# Patient Record
Sex: Male | Born: 1980
Health system: Southern US, Community
[De-identification: ages and names within clinical notes are randomized; demographics above are authoritative.]

## PROBLEM LIST (undated history)

## (undated) DIAGNOSIS — B2790 Infectious mononucleosis, unspecified without complication: Secondary | ICD-10-CM

## (undated) DIAGNOSIS — B001 Herpesviral vesicular dermatitis: Secondary | ICD-10-CM

## (undated) HISTORY — PX: VASECTOMY: SHX75

## (undated) HISTORY — DX: Infectious mononucleosis, unspecified without complication: B27.90

## (undated) HISTORY — DX: Herpesviral vesicular dermatitis: B00.1

---

## 2001-12-18 HISTORY — PX: OTHER SURGICAL HISTORY: SHX169

## 2002-12-18 DIAGNOSIS — B2790 Infectious mononucleosis, unspecified without complication: Secondary | ICD-10-CM

## 2002-12-18 HISTORY — DX: Infectious mononucleosis, unspecified without complication: B27.90

## 2004-04-15 ENCOUNTER — Inpatient Hospital Stay (HOSPITAL_COMMUNITY): Admission: EM | Admit: 2004-04-15 | Discharge: 2004-04-17 | Payer: Self-pay | Admitting: *Deleted

## 2004-04-22 ENCOUNTER — Encounter: Admission: RE | Admit: 2004-04-22 | Discharge: 2004-04-22 | Payer: Self-pay | Admitting: Family Medicine

## 2005-05-22 IMAGING — CT CT NECK W/ CM
1 series · 12 of 14 positions shown, 15 images · IV contrast (100 ML OMNI 300)
Comparison: none

CLINICAL DATA: Sore throat.
 CT OF THE NECK WITH CONTRAST
 3.75 mm cuts were obtained through the neck utilizing IV contrast (100 cc of Omnipaque 300).
 No prior studies for comparison but there is a lateral soft tissue film of the neck showing marked enlargement of the tonsils.
 CT confirms bilateral tonsillar enlargement.  There are areas of low attenuation within both tonsils that are ill-defined.  However, on the right, there is a more well-defined low attenuation lesion laterally, that has a suggestion of an enhancing rim.  This is seen on images #24-26.  The area measures approximately 22 x 16 mm.  It is suspicious for a tonsillar abscess.  At this time I see no definite prevertebral soft tissue swelling, or fluid collections outside the tonsils.  
 No significant adenopathy.  The salivary glands and airway are otherwise normal.
 IMPRESSION
 Enlarged edematous tonsils bilaterally, with a probable right tonsillar abscess.

[Series 2: — · axial · 0.39mm/px · z∈[-338,-124]mm · 12 of 69 slices shown, 15 images]
[im 6/69  soft-tissue]
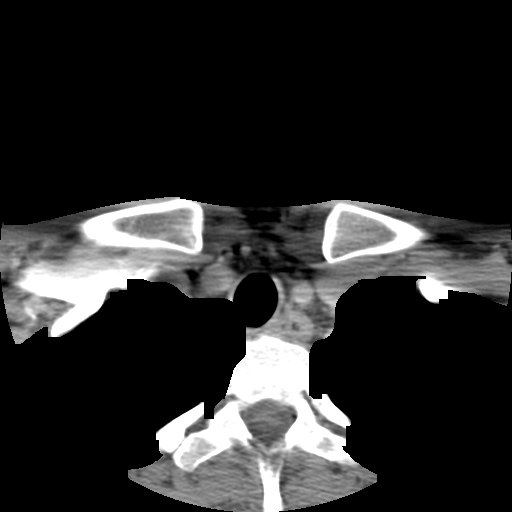
[im 6/69  bone]
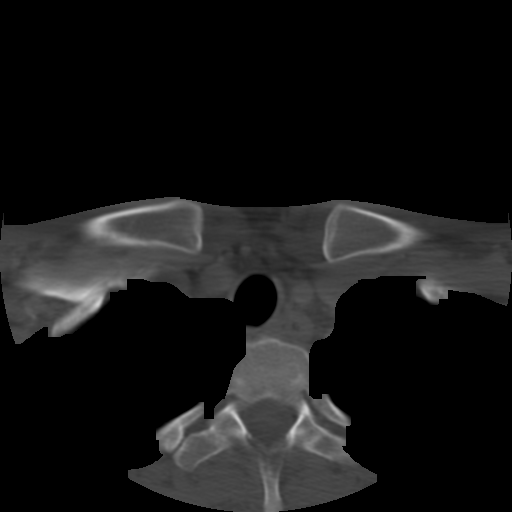
[im 11/69  bone]
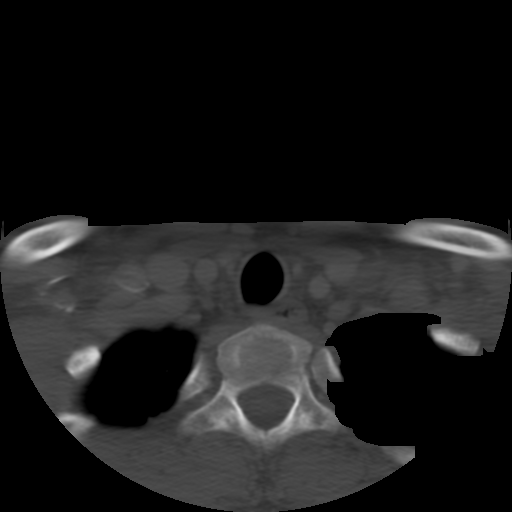
[im 16/69  bone]
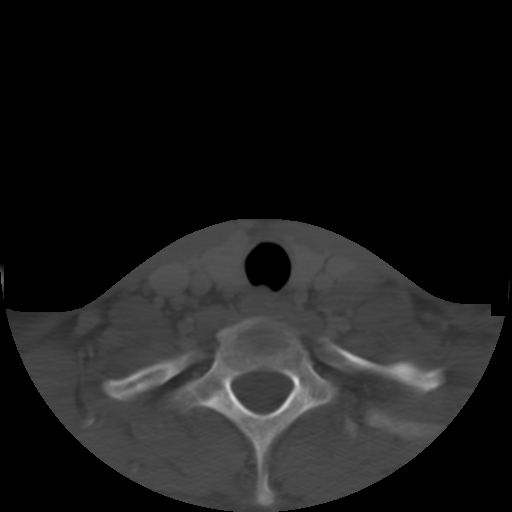
[im 21/69  bone]
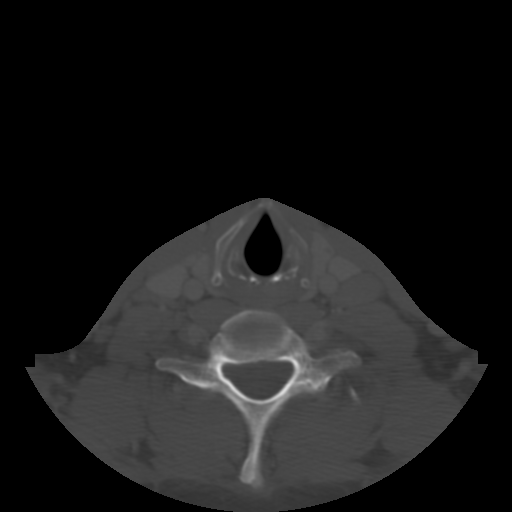
[im 27/69  soft-tissue]
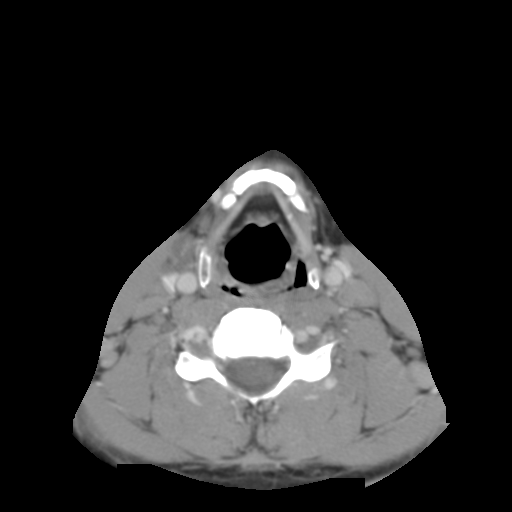
[im 27/69  bone]
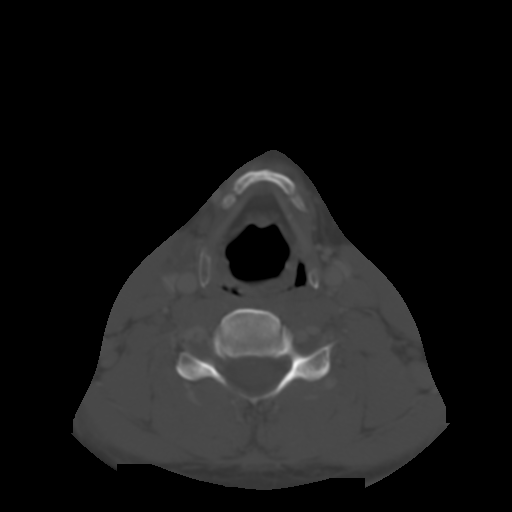
[im 32/69  bone]
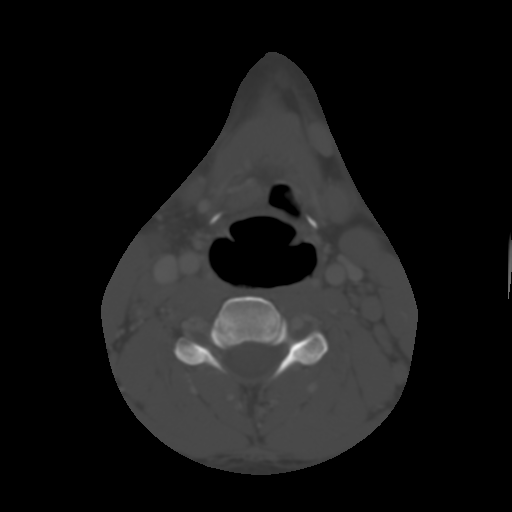
[im 37/69  bone]
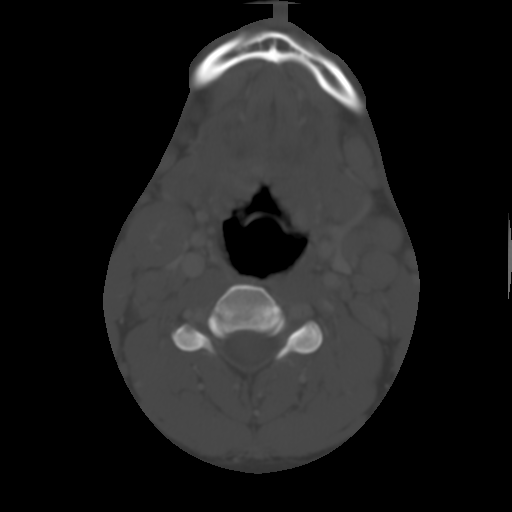
[im 42/69  bone]
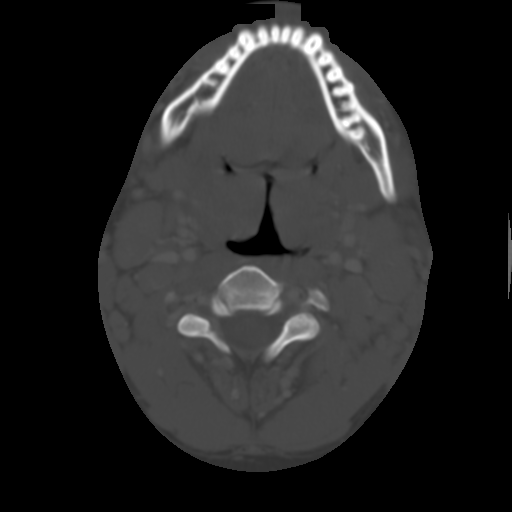
[im 48/69  soft-tissue]
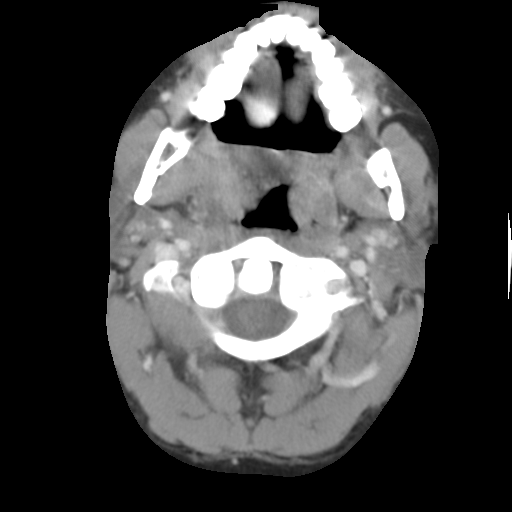
[im 48/69  bone]
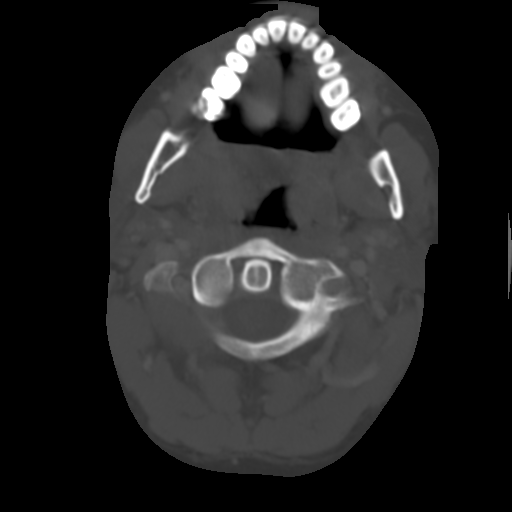
[im 53/69  bone]
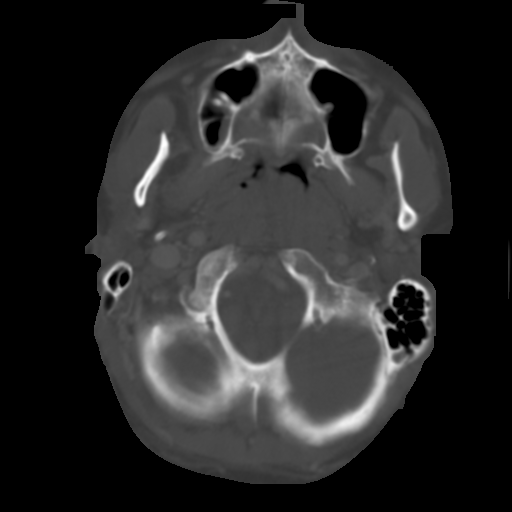
[im 58/69  bone]
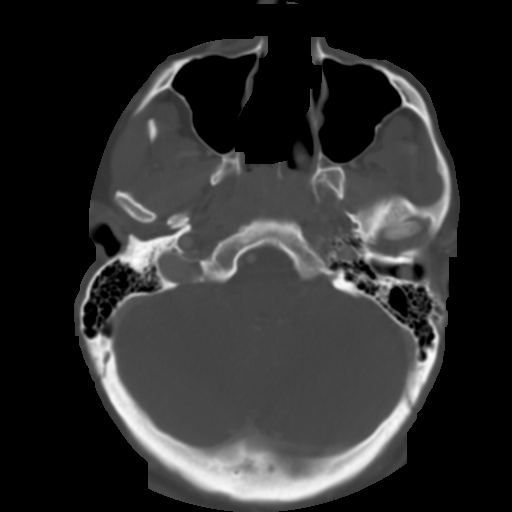
[im 63/69  bone]
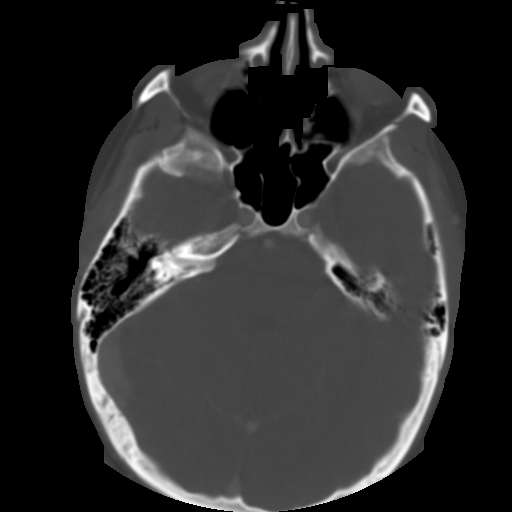

[12 of 14 positions shown; findings below may reference images not displayed]

## 2005-05-22 IMAGING — CR DG NECK SOFT TISSUE
1 series · 1 of 1 positions shown · non-contrast
Comparison: none

CLINICAL DATA: Tonsillar abscess.  Short of breath.  
 SOFT TISSUE LATERAL NECK 
 There is extensive soft tissue thickening of the posterior tongue probably involving the lingual tonsils.  There are no gas bubbles within this, but this could represent acute inflammation or even abscess.  This is narrowing the oral airway.  The tracheal air shadow is normal.  There is no prevertebral soft tissue swelling. 
 IMPRESSION
 Extensive swallowing of the lingual tonsils.

[view not recorded]
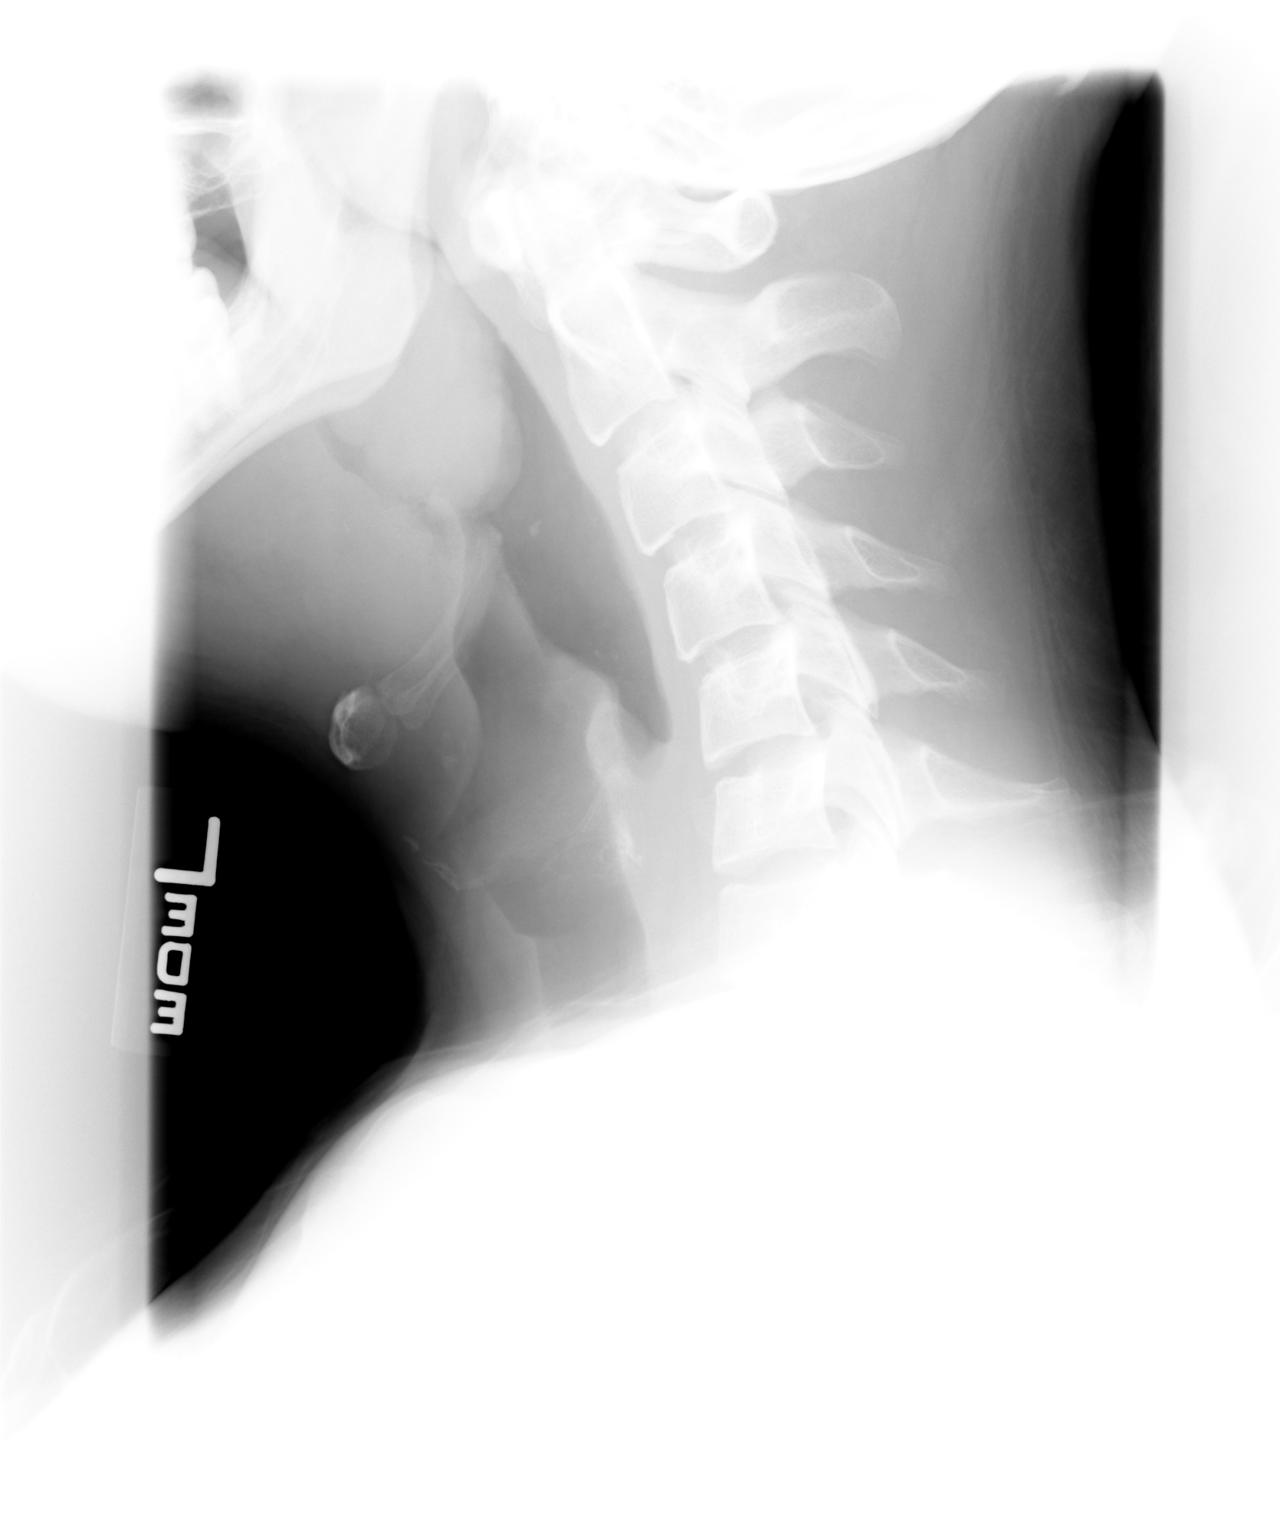

[1 of 1 positions shown; findings below may reference images not displayed]

## 2005-12-18 HISTORY — PX: TONSILLECTOMY AND ADENOIDECTOMY: SUR1326

## 2006-07-04 ENCOUNTER — Ambulatory Visit (HOSPITAL_COMMUNITY): Admission: RE | Admit: 2006-07-04 | Discharge: 2006-07-05 | Payer: Self-pay | Admitting: Otolaryngology

## 2006-12-07 ENCOUNTER — Ambulatory Visit (HOSPITAL_BASED_OUTPATIENT_CLINIC_OR_DEPARTMENT_OTHER): Admission: RE | Admit: 2006-12-07 | Discharge: 2006-12-07 | Payer: Self-pay | Admitting: Otolaryngology

## 2006-12-16 ENCOUNTER — Ambulatory Visit: Payer: Self-pay | Admitting: Internal Medicine

## 2011-04-27 ENCOUNTER — Ambulatory Visit (HOSPITAL_BASED_OUTPATIENT_CLINIC_OR_DEPARTMENT_OTHER): Payer: BC Managed Care – PPO | Attending: Otolaryngology

## 2011-04-27 DIAGNOSIS — G4710 Hypersomnia, unspecified: Secondary | ICD-10-CM | POA: Insufficient documentation

## 2011-05-06 DIAGNOSIS — G4730 Sleep apnea, unspecified: Secondary | ICD-10-CM

## 2011-05-06 DIAGNOSIS — G4710 Hypersomnia, unspecified: Secondary | ICD-10-CM

## 2011-05-06 NOTE — Procedures (Signed)
[name], Samuel Newton                 ACCOUNT NO.:  [id number]  MEDICAL RECORD NO.:  [id number]          PATIENT TYPE:  OUT  LOCATION:  SLEEP CENTER                 FACILITY:  [facility]  PHYSICIAN:  Clinton D. Maple Hudson, MD, FCCP, FACPDATE OF BIRTH:  04-26-80  DATE OF STUDY:  04/27/2011                           NOCTURNAL POLYSOMNOGRAM  REFERRING PHYSICIAN:  DAVID L SHOEMAKER  INDICATION FOR STUDY:  Hypersomnia with sleep apnea.  EPWORTH SLEEPINESS SCORE:  8/24.  BMI 25.  Weight 210 pounds.  Height 77 inches.  Neck 15 inches.  MEDICATIONS:  Home medications were not listed.  SLEEP ARCHITECTURE:  Total sleep time 444.5 minutes with sleep efficiency 96.8%.  Stage I was 7.5%, stage II 69.1%, stage III 4.8%, REM 18.6% of total sleep time.  Sleep latency 7 minutes, REM latency 84 minutes, awake after sleep onset 7 minutes, arousal index 8.8.  RESPIRATORY DATA:  Apnea/hypopnea index (AHI) 1.6 per hour.  A total of 12 events was scored including 4 obstructive apneas, 1 central apnea, 7 hypopneas.  Events were not positional.  REM AHI 3.6 per hour.  There were insufficient events to trigger initiation of CPAP titration by split protocol on the study night.  OXYGEN DATA:  Moderate snoring with oxygen desaturation to a nadir of 86% and the mean oxygen saturation through the study of 94.8% on room air.  CARDIAC DATA:  Normal sinus rhythm.  MOVEMENT-PARASOMNIA:  Occasional limb jerk with little effect on sleep. There were total of 5 limb jerks counted of which 1 was associated with arousal or awakening.  No bathroom trips.  IMPRESSIONS-RECOMMENDATIONS: 1. Unremarkable sleep architecture for Sleep Center environment     without medication taken. 2. Occasional respiratory event with sleep disturbance, within normal     limits, AHI 1.6 per hour (normal range 0/5 per hour).  Non-     positional events with moderate snoring.  Respiratory events tended     to be associated with intervals of REM  sleep.  Oxygen desaturation     to a nadir of 86% with mean oxygen saturation through the     study of 94.8% on room air. 3. Few limb jerks and little associated sleep disturbance related to     body movement.     Clinton D. Maple Hudson, MD, [facility], FACP Diplomate, Biomedical engineer of Sleep Medicine Electronically Signed    CDY/MEDQ  D:  05/06/2011 08:50:58  T:  05/06/2011 12:51:29  Job:  [id number]

## 2011-11-10 ENCOUNTER — Ambulatory Visit (INDEPENDENT_AMBULATORY_CARE_PROVIDER_SITE_OTHER): Payer: BC Managed Care – PPO | Admitting: Family Medicine

## 2011-11-10 ENCOUNTER — Encounter: Payer: Self-pay | Admitting: Family Medicine

## 2011-11-10 VITALS — BP 102/64 | HR 69 | Temp 98.0°F | Ht 77.0 in | Wt 215.0 lb

## 2011-11-10 DIAGNOSIS — Z209 Contact with and (suspected) exposure to unspecified communicable disease: Secondary | ICD-10-CM

## 2011-11-10 DIAGNOSIS — Z23 Encounter for immunization: Secondary | ICD-10-CM

## 2011-11-10 DIAGNOSIS — Z0000 Encounter for general adult medical examination without abnormal findings: Secondary | ICD-10-CM

## 2011-11-10 DIAGNOSIS — F4321 Adjustment disorder with depressed mood: Secondary | ICD-10-CM

## 2011-11-10 NOTE — Patient Instructions (Signed)
Let me know if your mood changes or gets worse.  You can get your results through our phone system.  Follow the instructions on the blue card. Take care.  Glad to see you.

## 2011-11-11 LAB — GC/CHLAMYDIA PROBE AMP, URINE
Chlamydia, Swab/Urine, PCR: NEGATIVE
GC Probe Amp, Urine: NEGATIVE

## 2011-11-12 ENCOUNTER — Encounter: Payer: Self-pay | Admitting: Family Medicine

## 2011-11-12 DIAGNOSIS — Z0000 Encounter for general adult medical examination without abnormal findings: Secondary | ICD-10-CM | POA: Insufficient documentation

## 2011-11-12 DIAGNOSIS — F4321 Adjustment disorder with depressed mood: Secondary | ICD-10-CM | POA: Insufficient documentation

## 2011-11-12 DIAGNOSIS — Z209 Contact with and (suspected) exposure to unspecified communicable disease: Secondary | ICD-10-CM | POA: Insufficient documentation

## 2011-11-12 NOTE — Assessment & Plan Note (Signed)
Flu and tdap today, d/w pt about diet and exercise.  Healthy habits encouraged.

## 2011-11-12 NOTE — Assessment & Plan Note (Addendum)
In counseling and f/o florid depressive sx. No Si/Hi.  Okay for outpatient f/u.  We talked about options.  He may not need medical tx at this point.  We agreed for him to continue with counseling and then notify me/MD if mood worsens.  Can consider trial of wellbutrin at that point. He agrees.  He has no sz history.

## 2011-11-12 NOTE — Progress Notes (Signed)
CPE- See plan.  Routine anticipatory guidance given to patient.  See health maintenance.  Possible depression.  separated from wife 07/2011.  Seeing a Veterinary surgeon.  Some procrastination due to lowered mood but no Si/Hi.  + hope.  We talked about options today.    He would like STD screening given the separation from his wife.    PMH and SH reviewed  Meds, vitals, and allergies reviewed.   ROS: See HPI.  Otherwise negative.    GEN: nad, alert and oriented HEENT: mucous membranes moist NECK: supple w/o LA CV: rrr. PULM: ctab, no inc wob ABD: soft, +bs EXT: no edema SKIN: no acute rash Affect and speech wnl

## 2011-11-12 NOTE — Assessment & Plan Note (Signed)
D/w pt about protection and will check labs.

## 2011-11-13 ENCOUNTER — Telehealth: Payer: Self-pay | Admitting: Radiology

## 2011-11-13 NOTE — Telephone Encounter (Signed)
Notified patient of his results. Also, told the patient that we needed a urine sample for routine ua/micro. He will come by early on Tuesday to leave a urine sample.

## 2011-11-13 NOTE — Telephone Encounter (Signed)
lmom for patient to return my call 

## 2011-11-22 ENCOUNTER — Other Ambulatory Visit (INDEPENDENT_AMBULATORY_CARE_PROVIDER_SITE_OTHER): Payer: BC Managed Care – PPO

## 2011-11-22 DIAGNOSIS — Z202 Contact with and (suspected) exposure to infections with a predominantly sexual mode of transmission: Secondary | ICD-10-CM

## 2011-11-23 LAB — URINALYSIS, MICROSCOPIC ONLY
Crystals: NONE SEEN
Squamous Epithelial / LPF: NONE SEEN

## 2011-11-23 LAB — URINALYSIS
Bilirubin Urine: NEGATIVE
Glucose, UA: NEGATIVE mg/dL
Nitrite: NEGATIVE

## 2014-02-17 ENCOUNTER — Telehealth: Payer: Self-pay

## 2014-02-17 NOTE — Telephone Encounter (Signed)
Congrats.  He should be good with the vaccine in 2012.  Flu shot is recommended if note already done.  Thanks.

## 2014-02-17 NOTE — Telephone Encounter (Signed)
Patient advised.

## 2014-02-17 NOTE — Telephone Encounter (Signed)
Pt expecting first baby in next couple of weeks; pt had tdap 11/10/2011 and pt wants to know if needs to get another tdap or will 2012 be sufficient.pt request cb.

## 2014-02-19 ENCOUNTER — Other Ambulatory Visit: Payer: Self-pay | Admitting: Family Medicine

## 2014-02-19 DIAGNOSIS — Z131 Encounter for screening for diabetes mellitus: Secondary | ICD-10-CM

## 2014-02-19 DIAGNOSIS — Z13220 Encounter for screening for lipoid disorders: Secondary | ICD-10-CM

## 2014-02-20 ENCOUNTER — Other Ambulatory Visit (INDEPENDENT_AMBULATORY_CARE_PROVIDER_SITE_OTHER): Payer: No Typology Code available for payment source

## 2014-02-20 DIAGNOSIS — Z13220 Encounter for screening for lipoid disorders: Secondary | ICD-10-CM

## 2014-02-20 DIAGNOSIS — Z131 Encounter for screening for diabetes mellitus: Secondary | ICD-10-CM

## 2014-02-20 LAB — LIPID PANEL
CHOL/HDL RATIO: 5
Cholesterol: 149 mg/dL (ref 0–200)
HDL: 32.1 mg/dL — AB (ref >39.00)
LDL Cholesterol: 98 mg/dL (ref 0–99)
TRIGLYCERIDES: 93 mg/dL (ref 0.0–149.0)
VLDL: 18.6 mg/dL (ref 0.0–40.0)

## 2014-02-20 LAB — GLUCOSE, RANDOM: Glucose, Bld: 81 mg/dL (ref 70–99)

## 2014-02-27 ENCOUNTER — Ambulatory Visit (INDEPENDENT_AMBULATORY_CARE_PROVIDER_SITE_OTHER): Payer: No Typology Code available for payment source | Admitting: Family Medicine

## 2014-02-27 ENCOUNTER — Encounter: Payer: Self-pay | Admitting: Family Medicine

## 2014-02-27 VITALS — BP 116/80 | HR 68 | Temp 98.0°F | Ht 76.0 in | Wt 257.5 lb

## 2014-02-27 DIAGNOSIS — Z0000 Encounter for general adult medical examination without abnormal findings: Secondary | ICD-10-CM

## 2014-02-27 NOTE — Patient Instructions (Signed)
Good luck with the baby.  I can do a punch biopsy on the spot on your back or we can set you up with dermatology.  Let me know about your preference. I would get a flu shot each fall.

## 2014-02-27 NOTE — Progress Notes (Signed)
Pre visit review using our clinic review tool, if applicable. No additional management support is needed unless otherwise documented below in the visit note.  CPE- See plan.  Routine anticipatory guidance given to patient.  See health maintenance. Tetanus 2012 Flu shot 2014 Colon and prostate cancer screening not due.  Diet and exercise.  Encouraged both.   Living will d/w pt.  Wife designated if patient if incapacitated.   Labs d/w pt.   Wife pregnant, due any day now.   Skin lesion on back, wanted eval.   PMH and SH reviewed  Meds, vitals, and allergies reviewed.   ROS: See HPI.  Otherwise negative.    GEN: nad, alert and oriented HEENT: mucous membranes moist NECK: supple w/o LA CV: rrr. PULM: ctab, no inc wob ABD: soft, +bs EXT: no edema SKIN: no acute rash but raised 6mm pigmented papule on R upper back.

## 2014-02-28 NOTE — Assessment & Plan Note (Addendum)
Routine anticipatory guidance given to patient.  See health maintenance. Tetanus 2012 Flu shot 2014 Colon and prostate cancer screening not due.  Diet and exercise.  Encouraged both.   Living will d/w pt.  Wife designated if patient if incapacitated.   Labs d/w pt.  We can either refer to derm or do a punch biopsy on his back.  He'll consider.  I would do one or the other.  No emergent.

## 2014-09-25 ENCOUNTER — Ambulatory Visit (INDEPENDENT_AMBULATORY_CARE_PROVIDER_SITE_OTHER): Payer: No Typology Code available for payment source | Admitting: Family Medicine

## 2014-09-25 ENCOUNTER — Encounter: Payer: Self-pay | Admitting: Family Medicine

## 2014-09-25 VITALS — BP 114/74 | HR 104 | Temp 98.8°F | Wt 249.0 lb

## 2014-09-25 DIAGNOSIS — J069 Acute upper respiratory infection, unspecified: Secondary | ICD-10-CM

## 2014-09-25 NOTE — Progress Notes (Signed)
Pre visit review using our clinic review tool, if applicable. No additional management support is needed unless otherwise documented below in the visit note.  The prev lesion on his back "dried up and feel off."   Sick ever since his child when to daycare, worse recently. Cough, ST.  Some sputum recently.  Vomited once today, felt better afterward.  Some diarrhea.   Watery diarrhea.  Had tmax 99.4 at night recently, usually ~98.  Had some chills o/w.  Not lightheaded.  Fatigued.  Still with UOP, a little less than normal. R ear ache recently.    Meds, vitals, and allergies reviewed.   ROS: See HPI.  Otherwise, noncontributory.  GEN: nad, alert and oriented HEENT: mucous membranes moist, tm w/o erythema overall but slightly injected, ETD noted, nasal exam w/o erythema, clear discharge noted,  OP with cobblestoning and typical viral appearing changes on the soft palate.  NECK: supple w/o LA CV: rrr.   PULM: ctab, no inc wob EXT: no edema SKIN: no acute rash

## 2014-09-25 NOTE — Patient Instructions (Signed)
Drink sips of fluids, take tylenol as needed, and gargle with warm salt water for your throat.  This should gradually improve.  Take care.  Let us know if you have other concerns.

## 2014-09-27 ENCOUNTER — Encounter: Payer: Self-pay | Admitting: Family Medicine

## 2014-09-27 DIAGNOSIS — J069 Acute upper respiratory infection, unspecified: Secondary | ICD-10-CM | POA: Insufficient documentation

## 2014-09-27 NOTE — Assessment & Plan Note (Signed)
Likely viral, nontoxic, should resolve.  See instructions. D/w pt about handwashing. Congratulated patient on the birth of his daughter earlier this year.

## 2015-04-20 ENCOUNTER — Ambulatory Visit (INDEPENDENT_AMBULATORY_CARE_PROVIDER_SITE_OTHER): Payer: No Typology Code available for payment source | Admitting: Family Medicine

## 2015-04-20 ENCOUNTER — Encounter: Payer: Self-pay | Admitting: Family Medicine

## 2015-04-20 ENCOUNTER — Telehealth: Payer: Self-pay | Admitting: Family Medicine

## 2015-04-20 VITALS — BP 104/70 | HR 68 | Temp 98.0°F | Wt 230.8 lb

## 2015-04-20 DIAGNOSIS — N50819 Testicular pain, unspecified: Secondary | ICD-10-CM | POA: Insufficient documentation

## 2015-04-20 DIAGNOSIS — N508 Other specified disorders of male genital organs: Secondary | ICD-10-CM | POA: Diagnosis not present

## 2015-04-20 LAB — POCT URINALYSIS DIPSTICK
BILIRUBIN UA: NEGATIVE
Glucose, UA: NEGATIVE
Ketones, UA: NEGATIVE
Leukocytes, UA: NEGATIVE
NITRITE UA: NEGATIVE
PH UA: 8
RBC UA: NEGATIVE
SPEC GRAV UA: 1.015
Urobilinogen, UA: 2

## 2015-04-20 NOTE — Progress Notes (Signed)
Pre visit review using our clinic review tool, if applicable. No additional management support is needed unless otherwise documented below in the visit note. 

## 2015-04-20 NOTE — Telephone Encounter (Signed)
Opened in error

## 2015-04-20 NOTE — Patient Instructions (Signed)
I think you have non infectious epididymitis from trauma vs viral inflammation today. Push water, use ibuprofen 400-600 mg three times daily with food, and elevate scrotum with towel. Urine looked ok today. If any worsening, let us know right away or seek urgent care.

## 2015-04-20 NOTE — Addendum Note (Signed)
Addended by: Eustaquio BoydenGUTIERREZ, Taylyn Brame on: 04/20/2015 12:15 PM   Modules accepted: Orders

## 2015-04-20 NOTE — Assessment & Plan Note (Addendum)
UA today normal. Anticipate traumatic noninfectious epididymitis vs viral orchitis after recent HFM enterovirus disease caught from daughter. Discussed supportive care including scheduled NSAIDs, elevation of scrotum and rest. Advised monitor temp and update if fever >101, any worsening, or not improving as expected. Pt agrees with plan. Not consistent with testicular torsion today or bacterial infectious cause. Addendum ==> pt requested CT/GC testing at end of visit. Collected and sent off. UCx sent.

## 2015-04-20 NOTE — Progress Notes (Addendum)
BP 104/70 mmHg  Pulse 68  Temp(Src) 98 F (36.7 C) (Oral)  Wt 230 lb 12.8 oz (104.69 kg)   CC: testicular pain  Subjective:    Patient ID: Samuel Newton, male    DOB: 03-Oct-1981, 34 y.o.   MRN: [id number]  HPI: Kaeo Jacome is a 34 y.o. male presenting on 04/20/2015 for Testicle Pain   3d h/o mild-moderate R testicular pain, worse to touch or when brushing against anything. Yesterday evening discomfort spread to left side, possible chills, and pelvic discomfort. No dysuria, discharge. No testicular swelling. No myalgias, malaise, headaches, abd pain.   Improved with ibuprofen. Lives with wife. Monogamous relationship. No condom use. No new partners.   Friday morning (prior to sxs developing) small dog jumped onto him and landed on testicle.  Recent HFM infection (with hand and foot and mouth rash) through daughter at daycare. Ongoing for 1 week.  Wife with current yeast infection.   No h/o testicular issues in past (torsion, infection, orchitis).  Relevant past medical, surgical, family and social history reviewed and updated as indicated. Interim medical history since our last visit reviewed. Allergies and medications reviewed and updated. Current Outpatient Prescriptions on File Prior to Visit  Medication Sig  . Multiple Vitamin (MULTIVITAMIN) tablet Take 2 tablets by mouth daily.     No current facility-administered medications on file prior to visit.    Review of Systems Per HPI unless specifically indicated above     Objective:    BP 104/70 mmHg  Pulse 68  Temp(Src) 98 F (36.7 C) (Oral)  Wt 230 lb 12.8 oz (104.69 kg)  Wt Readings from Last 3 Encounters:  04/20/15 230 lb 12.8 oz (104.69 kg)  09/25/14 249 lb (112.946 kg)  02/27/14 257 lb 8 oz (116.801 kg)    Physical Exam  Constitutional: He appears well-developed and well-nourished. No distress.  Abdominal: Soft. Bowel sounds are normal. He exhibits no distension and no mass. There is no  tenderness. There is no rebound and no guarding. Hernia confirmed negative in the right inguinal area and confirmed negative in the left inguinal area.  Genitourinary: Penis normal. Right testis shows swelling and tenderness. Right testis shows no mass. Right testis is descended. Left testis shows no mass, no swelling and no tenderness. Left testis is descended.  R testicle tender to palpation, epididymal swelling/tenderness present as well.  Musculoskeletal: He exhibits no edema.  Lymphadenopathy:       Right: No inguinal adenopathy present.       Left: No inguinal adenopathy present.  Skin: Skin is warm and dry. Rash noted.  Resolving blistering rash on palms and soles and dorsal hands and feet  Psychiatric: He has a normal mood and affect.  Nursing note and vitals reviewed.  Results for orders placed or performed in visit on 04/20/15  POCT Urinalysis Dipstick  Result Value Ref Range   Color, UA Yellow    Clarity, UA Clear    Glucose, UA Negative    Bilirubin, UA Negative    Ketones, UA Negative    Spec Grav, UA 1.015    Blood, UA Negative    pH, UA 8.0    Protein, UA Trace    Urobilinogen, UA 2.0    Nitrite, UA Negative    Leukocytes, UA Negative       Assessment & Plan:   Problem List Items Addressed This Visit    Testicular pain - Primary    UA today normal. Anticipate traumatic noninfectious  epididymitis vs viral orchitis after recent HFM enterovirus disease caught from daughter. Discussed supportive care including scheduled NSAIDs, elevation of scrotum and rest. Advised monitor temp and update if fever >101, any worsening, or not improving as expected. Pt agrees with plan. Not consistent with testicular torsion today or bacterial infectious cause. Addendum ==> pt requested CT/GC testing at end of visit. Collected and sent off. UCx sent.      Relevant Orders   POCT Urinalysis Dipstick (Completed)   GC/Chlamydia Probe Amp   Urine culture       Follow up  plan: Return if symptoms worsen or fail to improve.

## 2015-04-22 LAB — URINE CULTURE
COLONY COUNT: NO GROWTH
Organism ID, Bacteria: NO GROWTH

## 2015-04-22 LAB — GC/CHLAMYDIA PROBE AMP
CT PROBE, AMP APTIMA: NEGATIVE
GC Probe RNA: NEGATIVE

## 2016-03-17 ENCOUNTER — Other Ambulatory Visit: Payer: Self-pay | Admitting: Family Medicine

## 2016-03-17 DIAGNOSIS — Z131 Encounter for screening for diabetes mellitus: Secondary | ICD-10-CM

## 2016-03-17 DIAGNOSIS — E786 Lipoprotein deficiency: Secondary | ICD-10-CM

## 2016-03-24 ENCOUNTER — Other Ambulatory Visit (INDEPENDENT_AMBULATORY_CARE_PROVIDER_SITE_OTHER): Payer: BLUE CROSS/BLUE SHIELD

## 2016-03-24 DIAGNOSIS — Z131 Encounter for screening for diabetes mellitus: Secondary | ICD-10-CM | POA: Diagnosis not present

## 2016-03-24 DIAGNOSIS — E786 Lipoprotein deficiency: Secondary | ICD-10-CM | POA: Diagnosis not present

## 2016-03-24 LAB — LIPID PANEL
CHOL/HDL RATIO: 4
Cholesterol: 137 mg/dL (ref 0–200)
HDL: 38.8 mg/dL — ABNORMAL LOW (ref >39.00)
LDL CALC: 82 mg/dL (ref 0–99)
NONHDL: 97.71
TRIGLYCERIDES: 78 mg/dL (ref 0.0–149.0)
VLDL: 15.6 mg/dL (ref 0.0–40.0)

## 2016-03-24 LAB — GLUCOSE, RANDOM: Glucose, Bld: 89 mg/dL (ref 70–99)

## 2016-03-28 ENCOUNTER — Encounter: Payer: No Typology Code available for payment source | Admitting: Family Medicine

## 2016-03-30 ENCOUNTER — Encounter: Payer: Self-pay | Admitting: Family Medicine

## 2016-03-30 ENCOUNTER — Ambulatory Visit (INDEPENDENT_AMBULATORY_CARE_PROVIDER_SITE_OTHER): Payer: BLUE CROSS/BLUE SHIELD | Admitting: Family Medicine

## 2016-03-30 VITALS — BP 102/62 | HR 63 | Temp 97.9°F | Ht 76.0 in | Wt 214.2 lb

## 2016-03-30 DIAGNOSIS — Z0000 Encounter for general adult medical examination without abnormal findings: Secondary | ICD-10-CM

## 2016-03-30 DIAGNOSIS — IMO0001 Reserved for inherently not codable concepts without codable children: Secondary | ICD-10-CM

## 2016-03-30 NOTE — Progress Notes (Signed)
Pre visit review using our clinic review tool, if applicable. No additional management support is needed unless otherwise documented below in the visit note.  CPE- See plan.  Routine anticipatory guidance given to patient.  See health maintenance. Tetanus 2012 Flu shot 2016 Colon and prostate cancer screening not due.  Diet and exercise. Encouraged both.  Living will d/w pt. Wife designated if patient if incapacitated.  Labs d/w pt, wnl. Son is now 5218 days old.  I offered congrats.  All are doing well.    Prev testicle pain resolved after last OV.  No sx in the meantime.   Wants to get vasectomy.    PMH and SH reviewed  Meds, vitals, and allergies reviewed.   ROS: See HPI.  Otherwise negative.    GEN: nad, alert and oriented HEENT: mucous membranes moist NECK: supple w/o LA CV: rrr. PULM: ctab, no inc wob ABD: soft, +bs EXT: no edema SKIN: no acute rash

## 2016-03-30 NOTE — Patient Instructions (Signed)
Shirlee LimerickMarion will call about your referral. Take care.  Congrats.  Glad to see you.

## 2016-04-02 NOTE — Assessment & Plan Note (Signed)
Tetanus 2012  Flu shot 2016  Colon and prostate cancer screening not due.  Diet and exercise. Encouraged both.  Living will d/w pt. Wife designated if patient if incapacitated.  Labs d/w pt, wnl.  Son is now 8618 days old. I offered congrats. All are doing well.  Refer for vasectomy eval.

## 2016-10-18 ENCOUNTER — Encounter: Payer: Self-pay | Admitting: Primary Care

## 2016-10-18 ENCOUNTER — Ambulatory Visit (INDEPENDENT_AMBULATORY_CARE_PROVIDER_SITE_OTHER): Payer: 59 | Admitting: Primary Care

## 2016-10-18 VITALS — BP 120/72 | HR 62 | Temp 98.4°F | Ht 76.0 in | Wt 227.1 lb

## 2016-10-18 DIAGNOSIS — J029 Acute pharyngitis, unspecified: Secondary | ICD-10-CM

## 2016-10-18 DIAGNOSIS — H109 Unspecified conjunctivitis: Secondary | ICD-10-CM | POA: Diagnosis not present

## 2016-10-18 LAB — POCT RAPID STREP A (OFFICE): RAPID STREP A SCREEN: NEGATIVE

## 2016-10-18 MED ORDER — POLYMYXIN B-TRIMETHOPRIM 10000-0.1 UNIT/ML-% OP SOLN: 1.0000 [drp] | Freq: Four times a day (QID) | OPHTHALMIC | 0 refills | Status: DC

## 2016-10-18 NOTE — Progress Notes (Signed)
Pre visit review using our clinic review tool, if applicable. No additional management support is needed unless otherwise documented below in the visit note. 
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
## 2016-10-18 NOTE — Patient Instructions (Addendum)
Start Trimethoprim-Polymixin B eye drops.  Instill 1 drop into both eyes four times daily for 5-7 days.  Your strep test was negative. Your sore throat is likely from drainage. Resume Zyrtec at bedtime and take daily for the next 2-3 weeks.  Please notify us if no improvement in 4-5 days.  It was a pleasure meeting you!

## 2016-10-18 NOTE — Addendum Note (Signed)
Addended by: Tawnya CrookSAMBATH, Adyson Vanburen on: 10/18/2016 10:23 AM   Modules accepted: Orders

## 2016-10-31 ENCOUNTER — Encounter: Payer: Self-pay | Admitting: Family Medicine

## 2018-05-16 ENCOUNTER — Other Ambulatory Visit: Payer: Self-pay | Admitting: Family Medicine

## 2018-05-16 DIAGNOSIS — Z131 Encounter for screening for diabetes mellitus: Secondary | ICD-10-CM

## 2018-05-17 ENCOUNTER — Other Ambulatory Visit (INDEPENDENT_AMBULATORY_CARE_PROVIDER_SITE_OTHER): Payer: 59

## 2018-05-17 DIAGNOSIS — Z131 Encounter for screening for diabetes mellitus: Secondary | ICD-10-CM | POA: Diagnosis not present

## 2018-05-17 LAB — GLUCOSE, RANDOM: Glucose, Bld: 89 mg/dL (ref 70–99)

## 2018-05-30 ENCOUNTER — Ambulatory Visit (INDEPENDENT_AMBULATORY_CARE_PROVIDER_SITE_OTHER): Payer: 59 | Admitting: Family Medicine

## 2018-05-30 ENCOUNTER — Encounter: Payer: Self-pay | Admitting: Family Medicine

## 2018-05-30 VITALS — BP 116/70 | HR 70 | Temp 97.8°F | Ht 77.0 in | Wt 211.2 lb

## 2018-05-30 DIAGNOSIS — Z0000 Encounter for general adult medical examination without abnormal findings: Secondary | ICD-10-CM | POA: Diagnosis not present

## 2018-05-30 DIAGNOSIS — Z7189 Other specified counseling: Secondary | ICD-10-CM

## 2018-05-30 NOTE — Patient Instructions (Addendum)
Ask your insurance about coverage for an MMR titer vs vaccine.   It may be cheaper to get the MMR vaccine at the health department.   Take care.  Glad to see you.  Thanks for your effort.  

## 2018-05-30 NOTE — Progress Notes (Signed)
CPE- See plan.  Routine anticipatory guidance given to patient.  See health maintenance.  The possibility exists that previously documented standard health maintenance information may have been brought forward from a previous encounter into this note.  If needed, that same information has been updated to reflect the current situation based on today's encounter.    Tetanus 2012 Flu shot encouraged, done yearly.   Colon and prostate cancer screening not due.  Diet and exercise d/w pt.  Living will d/w pt. Wife designated if patient if incapacitated.  Labs d/w pt, sugar wnl.   We talked about MMR vaccination.  He had 1 known dose.  He can check on coverage for MMR titer versus vaccine clinic.  It may end up being cheaper to get the MMR vaccine at the health department.  Discussed with patient.  He went 6+ months w/o a soda.  He has intentional weight loss.  I thanked him for his effort.    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  This was a complete surprise.  He is losing his employer based insurance soon. His wife was an employee there too and she was laid off.  He will update me as needed.  His mood is still appropriate.  He is trying to find a job quickly.  Discussed.    PMH and SH reviewed  Meds, vitals, and allergies reviewed.   ROS: Per HPI.  Unless specifically indicated otherwise in HPI, the patient denies:  General: fever. Eyes: acute vision changes ENT: sore throat Cardiovascular: chest pain Respiratory: SOB GI: vomiting GU: dysuria Musculoskeletal: acute back pain Derm: acute rash Neuro: acute motor dysfunction Psych: worsening mood Endocrine: polydipsia Heme: bleeding Allergy: hayfever  GEN: nad, alert and oriented HEENT: mucous membranes moist NECK: supple w/o LA CV: rrr. PULM: ctab, no inc wob ABD: soft, +bs EXT: no edema SKIN: no acute rash

## 2018-05-31 DIAGNOSIS — Z7189 Other specified counseling: Secondary | ICD-10-CM | POA: Insufficient documentation

## 2018-05-31 NOTE — Assessment & Plan Note (Signed)
Tetanus 2012 Flu shot encouraged, done yearly.   Colon and prostate cancer screening not due.  Diet and exercise d/w pt.  Living will d/w pt. Wife designated if patient if incapacitated.  Labs d/w pt, sugar wnl.   We talked about MMR vaccination.  He had 1 known dose.  He can check on coverage for MMR titer versus vaccine clinic.  It may end up being cheaper to get the MMR vaccine at the health department.  Discussed with patient.  

## 2018-05-31 NOTE — Assessment & Plan Note (Signed)
Living will d/w pt. Wife designated if patient if incapacitated.  

## 2018-11-07 ENCOUNTER — Ambulatory Visit (HOSPITAL_COMMUNITY)
Admission: EM | Admit: 2018-11-07 | Discharge: 2018-11-07 | Disposition: A | Discharge status: Home / Self Care | Payer: BC Managed Care – PPO | Attending: Family Medicine | Admitting: Family Medicine

## 2018-11-07 ENCOUNTER — Other Ambulatory Visit: Payer: Self-pay

## 2018-11-07 ENCOUNTER — Encounter (HOSPITAL_COMMUNITY): Payer: Self-pay | Admitting: Emergency Medicine

## 2018-11-07 DIAGNOSIS — B001 Herpesviral vesicular dermatitis: Secondary | ICD-10-CM

## 2018-11-07 MED ORDER — VALACYCLOVIR HCL 1 G PO TABS: 1000.0000 mg | ORAL_TABLET | Freq: Three times a day (TID) | ORAL | 2 refills | Status: DC

## 2018-11-07 NOTE — ED Triage Notes (Signed)
Patient reports cold sores started approx 1 week ago.  Patient says this is the most unusual episode of cold sores.  Patient also has tongue pain, gum pain.
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
## 2019-05-13 ENCOUNTER — Telehealth: Payer: Self-pay | Admitting: Family Medicine

## 2019-05-13 NOTE — Telephone Encounter (Signed)
Best number [phone] Pt has labs 6/29 and he wants to make he has a lipid panel

## 2019-05-14 ENCOUNTER — Other Ambulatory Visit: Payer: Self-pay | Admitting: Family Medicine

## 2019-05-14 DIAGNOSIS — E786 Lipoprotein deficiency: Secondary | ICD-10-CM

## 2019-05-14 DIAGNOSIS — Z131 Encounter for screening for diabetes mellitus: Secondary | ICD-10-CM

## 2019-05-14 NOTE — Telephone Encounter (Signed)
Ordered glucose and lipids.  That should cover it.  Thanks.

## 2019-05-14 NOTE — Telephone Encounter (Signed)
Ivin Booty notified by telephone.

## 2019-06-12 ENCOUNTER — Other Ambulatory Visit: Payer: BC Managed Care – PPO

## 2019-06-16 ENCOUNTER — Other Ambulatory Visit (INDEPENDENT_AMBULATORY_CARE_PROVIDER_SITE_OTHER): Payer: BC Managed Care – PPO

## 2019-06-16 ENCOUNTER — Other Ambulatory Visit: Payer: Self-pay

## 2019-06-16 DIAGNOSIS — E786 Lipoprotein deficiency: Secondary | ICD-10-CM

## 2019-06-16 DIAGNOSIS — Z131 Encounter for screening for diabetes mellitus: Secondary | ICD-10-CM

## 2019-06-16 LAB — LIPID PANEL
Cholesterol: 124 mg/dL (ref 0–200)
HDL: 45.6 mg/dL (ref >39.00)
LDL Cholesterol: 68 mg/dL (ref 0–99)
NonHDL: 77.94
Total CHOL/HDL Ratio: 3
Triglycerides: 48 mg/dL (ref 0.0–149.0)
VLDL: 9.6 mg/dL (ref 0.0–40.0)

## 2019-06-16 LAB — GLUCOSE, RANDOM: Glucose, Bld: 76 mg/dL (ref 70–99)

## 2019-06-19 ENCOUNTER — Other Ambulatory Visit: Payer: Self-pay

## 2019-06-19 ENCOUNTER — Encounter: Payer: Self-pay | Admitting: Family Medicine

## 2019-06-19 ENCOUNTER — Ambulatory Visit (INDEPENDENT_AMBULATORY_CARE_PROVIDER_SITE_OTHER): Payer: BC Managed Care – PPO | Admitting: Family Medicine

## 2019-06-19 VITALS — BP 112/62 | HR 70 | Temp 98.3°F | Ht 77.0 in | Wt 196.5 lb

## 2019-06-19 DIAGNOSIS — Z7189 Other specified counseling: Secondary | ICD-10-CM

## 2019-06-19 DIAGNOSIS — Z0000 Encounter for general adult medical examination without abnormal findings: Secondary | ICD-10-CM | POA: Diagnosis not present

## 2019-06-19 NOTE — Progress Notes (Addendum)
CPE- See plan.  Routine anticipatory guidance given to patient.  See health maintenance.  The possibility exists that previously documented standard health maintenance information may have been brought forward from a previous encounter into this note.  If needed, that same information has been updated to reflect the current situation based on today's encounter.    Tetanus 2012 Flu shot encouraged, done yearly.   Colon and prostate cancer screening not due.  Diet and exercise d/w pt. Doing well with both.   Living will d/w pt. Wife designated if patient is incapacitated.  Labs d/w pt, sugar wnl.   He has intentional weight loss with diet and running.   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   R lateral collarbone is more prominent laterally.  No pain.  L medial collarbone is slightly more prominent medially.  L bunion more prominent than R foot. Not painful.   He wanted all that evaluated since he had noted those changes recently.  Unclear how long they have actually been present, but he had recently noted him.  Recheck pulse 70.  Clearly regular on exam.  No cardiovascular symptoms.  No chest pain shortness of breath or swelling.  PMH and SH reviewed Meds, vitals, and allergies reviewed.   ROS: Per HPI.  Unless specifically indicated otherwise in HPI, the patient denies:  General: fever. Eyes: acute vision changes ENT: sore throat Cardiovascular: chest pain Respiratory: SOB GI: vomiting GU: dysuria Musculoskeletal: acute back pain Derm: acute rash Neuro: acute motor dysfunction Psych: worsening mood Endocrine: polydipsia Heme: bleeding Allergy: hayfever  GEN: nad, alert and oriented HEENT: ncat NECK: supple w/o LA CV: rrr. PULM: ctab, no inc wob ABD: soft, +bs EXT: no edema SKIN: no acute rash Benign-appearing left bunion noted. He has some prominence near the right [facility] joint and he also has some  mild prominence near the left [place] joint. He has normal range of motion of both arms and no pain on AC joint loading.  Neither of the areas described above have erythema or swelling otherwise.

## 2019-06-19 NOTE — Patient Instructions (Signed)
Thank you for your effort.  Take care.  Glad to see you.  Update me as needed.  I would get a flu shot each fall.

## 2019-06-21 ENCOUNTER — Encounter: Payer: Self-pay | Admitting: Family Medicine

## 2019-06-21 NOTE — Assessment & Plan Note (Signed)
Living will d/w pt.  Wife designated if patient is incapacitated. ?

## 2019-06-21 NOTE — Assessment & Plan Note (Signed)
Tetanus 2012 Flu shot encouraged, done yearly.   Colon and prostate cancer screening not due.  Diet and exercise d/w pt. Doing well with both.   Living will d/w pt. Wife designated if patient if incapacitated.  Labs d/w pt, sugar wnl.   He has intentional weight loss with diet and running.  He has done really well with that.  He has a bunion and a slightly prominent right AC area and a slightly prominent left [place] area.  None of that appears to need intervention.  He had lost weight recently and I question if some of the changes noted near his clavicles are more prominent given his weight loss.  None of this looks like an acute issue and he has no recent injuries.  Discussed, reassured.  Update me as needed.

## 2019-07-04 ENCOUNTER — Telehealth: Payer: Self-pay | Admitting: Family Medicine

## 2019-07-04 DIAGNOSIS — Z9189 Other specified personal risk factors, not elsewhere classified: Secondary | ICD-10-CM

## 2019-07-04 NOTE — Telephone Encounter (Signed)
I put in the order, please direct him to a site for testing.  Thanks.

## 2019-07-04 NOTE — Telephone Encounter (Signed)
Patient stated he would like a covid test ordered Patient does not have any symptoms but traveled out of state recently and would like to be tested.   C/B # [phone]

## 2019-07-04 NOTE — Telephone Encounter (Signed)
Left detailed message on voicemail.  

## 2019-07-07 ENCOUNTER — Other Ambulatory Visit: Payer: Self-pay | Admitting: Family Medicine

## 2019-07-07 DIAGNOSIS — Z20822 Contact with and (suspected) exposure to covid-19: Secondary | ICD-10-CM

## 2019-07-10 LAB — NOVEL CORONAVIRUS, NAA: SARS-CoV-2, NAA: NOT DETECTED

## 2019-08-06 ENCOUNTER — Other Ambulatory Visit: Payer: Self-pay

## 2019-08-06 DIAGNOSIS — Z20822 Contact with and (suspected) exposure to covid-19: Secondary | ICD-10-CM

## 2019-08-07 LAB — NOVEL CORONAVIRUS, NAA: SARS-CoV-2, NAA: NOT DETECTED

## 2020-01-19 ENCOUNTER — Ambulatory Visit: Payer: BC Managed Care – PPO | Attending: Internal Medicine

## 2020-01-19 ENCOUNTER — Other Ambulatory Visit: Payer: BC Managed Care – PPO

## 2020-01-19 DIAGNOSIS — Z20822 Contact with and (suspected) exposure to covid-19: Secondary | ICD-10-CM

## 2020-01-20 LAB — NOVEL CORONAVIRUS, NAA: SARS-CoV-2, NAA: NOT DETECTED

## 2021-02-15 ENCOUNTER — Telehealth: Payer: Self-pay | Admitting: Family Medicine

## 2021-02-15 NOTE — Telephone Encounter (Signed)
Error

## 2021-02-19 ENCOUNTER — Other Ambulatory Visit: Payer: Self-pay | Admitting: Family Medicine

## 2021-02-19 DIAGNOSIS — Z131 Encounter for screening for diabetes mellitus: Secondary | ICD-10-CM

## 2021-02-19 NOTE — Progress Notes (Signed)
Please check with patient about his upcoming lab visit.  It appears that all he needs is a fasting glucose.  He can get that done ahead of time if he wants to, but it would be fine to do a fasting fingerstick at the office visit.  That way he could skip the lab appointment.  Please see which way he wants to go.  Thanks.

## 2021-02-22 NOTE — Progress Notes (Signed)
LMTCB to see about doing labs tomorrow or if he just wants to do them at his appt. If wants done at appt will need to change to a morning appt as he needs fasting labs done.

## 2021-02-23 ENCOUNTER — Other Ambulatory Visit: Payer: Self-pay

## 2021-02-23 ENCOUNTER — Other Ambulatory Visit (INDEPENDENT_AMBULATORY_CARE_PROVIDER_SITE_OTHER): Payer: BC Managed Care – PPO

## 2021-02-23 ENCOUNTER — Other Ambulatory Visit: Payer: BC Managed Care – PPO

## 2021-02-23 DIAGNOSIS — Z131 Encounter for screening for diabetes mellitus: Secondary | ICD-10-CM | POA: Diagnosis not present

## 2021-02-23 LAB — GLUCOSE, RANDOM: Glucose, Bld: 86 mg/dL (ref 70–99)

## 2021-02-23 NOTE — Progress Notes (Signed)
Patient had labs done this morning.

## 2021-03-03 ENCOUNTER — Ambulatory Visit (INDEPENDENT_AMBULATORY_CARE_PROVIDER_SITE_OTHER): Payer: BC Managed Care – PPO | Admitting: Family Medicine

## 2021-03-03 ENCOUNTER — Encounter: Payer: Self-pay | Admitting: Family Medicine

## 2021-03-03 ENCOUNTER — Other Ambulatory Visit: Payer: Self-pay

## 2021-03-03 VITALS — BP 118/72 | HR 78 | Temp 98.4°F | Ht 77.0 in | Wt 213.0 lb

## 2021-03-03 DIAGNOSIS — Z7189 Other specified counseling: Secondary | ICD-10-CM

## 2021-03-03 DIAGNOSIS — Z0000 Encounter for general adult medical examination without abnormal findings: Secondary | ICD-10-CM | POA: Diagnosis not present

## 2021-03-03 DIAGNOSIS — M79646 Pain in unspecified finger(s): Secondary | ICD-10-CM

## 2021-03-03 DIAGNOSIS — B001 Herpesviral vesicular dermatitis: Secondary | ICD-10-CM

## 2021-03-03 DIAGNOSIS — M21619 Bunion of unspecified foot: Secondary | ICD-10-CM

## 2021-03-03 DIAGNOSIS — M791 Myalgia, unspecified site: Secondary | ICD-10-CM

## 2021-03-03 MED ORDER — VALACYCLOVIR HCL 1 G PO TABS: 1000.0000 mg | ORAL_TABLET | Freq: Three times a day (TID) | ORAL | 2 refills | Status: DC

## 2021-03-03 NOTE — Patient Instructions (Addendum)
Try using a thumb spica splint and see if that helps.  Take care.  Glad to see you. Update me as needed.  Thanks for your effort.  If the bunion bothers you more then let me know.

## 2021-03-03 NOTE — Progress Notes (Signed)
This visit occurred during the SARS-CoV-2 public health emergency.  Safety protocols were in place, including screening questions prior to the visit, additional usage of staff PPE, and extensive cleaning of exam room while observing appropriate contact time as indicated for disinfecting solutions.  CPE- See plan.  Routine anticipatory guidance given to patient.  See health maintenance.  The possibility exists that previously documented standard health maintenance information may have been brought forward from a previous encounter into this note.  If needed, that same information has been updated to reflect the current situation based on today's encounter.    Tetanus 2012 Flu shot encouraged, done yearly.  covid vaccine up to date.   PNA and shingles not due.   Colon and prostate cancer screening not due.  Diet and exercise d/w pt.  Living will d/w pt. Wife designated if patient if incapacitated.  Labs d/w pt, sugar wnl.   D/w pt about valtrex use.  Used prn.  He starts tx when sx start.  That works.  He does not appear to need daily suppressive therapy.  L thumb pain with grip.  No pain with ROM.  Pain can last a few days, when triggered.  Electrical pain, radial side of the thumb.  R handed.    Center of chest is sore, medial pectoral area noted with ROM for deep bench press.  Slowly getting better.   Clearly reproducible.  No exertional symptoms otherwise.  Bunion L foot.  Not symptomatic usually, d/w pt about observation.  If the   PMH and SH reviewed Meds, vitals, and allergies reviewed.  ROS: Per HPI.  Unless specifically indicated otherwise in HPI, the patient denies:  General: fever. Eyes: acute vision changes ENT: sore throat Cardiovascular: chest pain Respiratory: SOB GI: vomiting GU: dysuria Musculoskeletal: acute back pain Derm: acute rash Neuro: acute motor dysfunction Psych: worsening mood Endocrine: polydipsia Heme: bleeding Allergy: hayfever  GEN: nad, alert  and oriented HEENT: ncat NECK: supple w/o LA CV: rrr. PULM: ctab, no inc wob ABD: soft, +bs EXT: no edema SKIN: no acute rash Bunion noted on left foot.  No skin breakdown. He has reproducible medial pectoral muscle irritation with stretching pectoral muscles.  No rash.  No bruising. He has normal sensation and grip in the left hand.  He does not have pain on testing extensor or flexor tendons.  Normal capillary refill.  No joint erythema or swelling.

## 2021-03-06 DIAGNOSIS — M21619 Bunion of unspecified foot: Secondary | ICD-10-CM | POA: Insufficient documentation

## 2021-03-06 DIAGNOSIS — M79646 Pain in unspecified finger(s): Secondary | ICD-10-CM | POA: Insufficient documentation

## 2021-03-06 DIAGNOSIS — B001 Herpesviral vesicular dermatitis: Secondary | ICD-10-CM | POA: Insufficient documentation

## 2021-03-06 DIAGNOSIS — M791 Myalgia, unspecified site: Secondary | ICD-10-CM | POA: Insufficient documentation

## 2021-03-06 NOTE — Assessment & Plan Note (Signed)
Continue as needed treatment update me as needed.  Prescription for Valtrex sent.

## 2021-03-06 NOTE — Assessment & Plan Note (Signed)
Living will d/w pt. Wife designated if patient if incapacitated.

## 2021-03-06 NOTE — Assessment & Plan Note (Signed)
Gradually getting better.  Does not appear to be an intrathoracic issue.  Clearly reproducible.  Relative rest in the meantime then gradually return to exercise.  He agrees.

## 2021-03-06 NOTE — Assessment & Plan Note (Signed)
Tetanus 2012 Flu shot encouraged, done yearly.  covid vaccine up to date.   PNA and shingles not due.   Colon and prostate cancer screening not due.  Diet and exercise d/w pt.  Living will d/w pt. Wife designated if patient if incapacitated.  Labs d/w pt, sugar wnl.

## 2021-03-06 NOTE — Assessment & Plan Note (Signed)
I think he likely irritated the digital nerve and that was causing episodic symptoms.  He could use a spica splint to try to protect the area.  He will update me as needed.  See after visit summary.

## 2021-03-06 NOTE — Assessment & Plan Note (Signed)
Not affecting his gait.  Would observe for now.  We can refer to podiatry if needed.

## 2022-10-29 NOTE — Progress Notes (Unsigned)
    Azariah Latendresse T. Asjah Rauda, MD, CAQ Sports Medicine [facility] [address] [place], [address]  Phone: [phone]  FAX: [phone]  Dreyden Rohrman - 41 y.o. male  MRN [id number]  Date of Birth: 26-Oct-1981  Date: 10/30/2022  PCP: Joaquim Nam, MD  Referral: Joaquim Nam, MD  No chief complaint on file.  Subjective:   Leemon Ayala is a 41 y.o. very pleasant male patient with There is no height or weight on file to calculate BMI. who presents with the following:  Patient presents with L heel pain.     Review of Systems is noted in the HPI, as appropriate  Objective:   There were no vitals taken for this visit.  GEN: No acute distress; alert,appropriate. PULM: Breathing comfortably in no respiratory distress PSYCH: Normally interactive.   Laboratory and Imaging Data:  Assessment and Plan:   ***

## 2022-10-30 ENCOUNTER — Ambulatory Visit: Payer: BC Managed Care – PPO | Admitting: Family Medicine

## 2022-10-30 ENCOUNTER — Encounter: Payer: Self-pay | Admitting: Family Medicine

## 2022-10-30 VITALS — BP 106/72 | HR 62 | Temp 98.0°F | Ht 76.25 in | Wt 231.0 lb

## 2022-10-30 DIAGNOSIS — M7661 Achilles tendinitis, right leg: Secondary | ICD-10-CM

## 2022-10-30 DIAGNOSIS — M76821 Posterior tibial tendinitis, right leg: Secondary | ICD-10-CM | POA: Diagnosis not present

## 2022-10-30 DIAGNOSIS — M79672 Pain in left foot: Secondary | ICD-10-CM

## 2022-10-30 MED ORDER — NITROGLYCERIN 0.2 MG/HR TD PT24: MEDICATED_PATCH | TRANSDERMAL | 2 refills | Status: DC

## 2022-10-30 MED ORDER — VALACYCLOVIR HCL 1 G PO TABS: 1000.0000 mg | ORAL_TABLET | Freq: Three times a day (TID) | ORAL | 0 refills | Status: DC

## 2022-10-30 NOTE — Patient Instructions (Signed)
Achilles Tendon Rehab  Start easy.  It is ok if there is mild discomfort, but if there is pain, then back off how much rehab you are doing.  For achilles rehab, you basically just need to concentrate on the ankle going up and down.  Start: Calf raises while seated First lower and then raise on both feet Assist lifting with hands and then slowly lower  Begin with 3 sets of 10 repetitions Increase by 5 repetitions every 3 days  Goal is 3 sets of 30 repetitions  If feels good at 3 sets of 30 - add backpack with 5 lbs  Increase by 5 lbs per week to max of 30 lbs   Alternative: If you have weights at home, you can put a dumbbell upright on the knee of the effected heel.  Go up with assistance from your hands, and then lower slowly.  If this is easy, then change to calf lowers standing on a step.  A heel cup of any brand will elevate the heel and take stress off of the achilles tendon. I particularly like the brand called Tuli's heel cups.  They cup the back of the heel, too.  They can be hard to find unless you can order off of the computer like on [employer]. Any heel cup is ok, though, including ones you can find at any pharmacy  

## 2023-03-07 ENCOUNTER — Emergency Department (HOSPITAL_COMMUNITY)
Admission: EM | Admit: 2023-03-07 | Discharge: 2023-03-07 | Disposition: A | Discharge status: Home / Self Care | Payer: BC Managed Care – PPO | Attending: Emergency Medicine | Admitting: Emergency Medicine

## 2023-03-07 ENCOUNTER — Emergency Department (HOSPITAL_COMMUNITY): Payer: BC Managed Care – PPO

## 2023-03-07 ENCOUNTER — Telehealth: Payer: Self-pay

## 2023-03-07 ENCOUNTER — Other Ambulatory Visit: Payer: Self-pay

## 2023-03-07 DIAGNOSIS — R0789 Other chest pain: Secondary | ICD-10-CM

## 2023-03-07 LAB — BASIC METABOLIC PANEL
Anion gap: 9 (ref 5–15)
BUN: 9 mg/dL (ref 6–20)
CO2: 24 mmol/L (ref 22–32)
Calcium: 8.8 mg/dL — ABNORMAL LOW (ref 8.9–10.3)
Chloride: 104 mmol/L (ref 98–111)
Creatinine, Ser: 0.83 mg/dL (ref 0.61–1.24)
GFR, Estimated: >60 mL/min (ref >60)
Glucose, Bld: 92 mg/dL (ref 70–99)
Potassium: 3.8 mmol/L (ref 3.5–5.1)
Sodium: 137 mmol/L (ref 135–145)

## 2023-03-07 LAB — CBC
HCT: 43.5 % (ref 39.0–52.0)
Hemoglobin: 14.8 g/dL (ref 13.0–17.0)
MCH: 30.5 pg (ref 26.0–34.0)
MCHC: 34 g/dL (ref 30.0–36.0)
MCV: 89.5 fL (ref 80.0–100.0)
Platelets: 277 10*3/uL (ref 150–400)
RBC: 4.86 MIL/uL (ref 4.22–5.81)
RDW: 12.3 % (ref 11.5–15.5)
WBC: 6.3 10*3/uL (ref 4.0–10.5)
nRBC: 0 % (ref 0.0–0.2)

## 2023-03-07 LAB — TROPONIN I (HIGH SENSITIVITY)
Troponin I (High Sensitivity): 3 ng/L (ref <18)
Troponin I (High Sensitivity): 3 ng/L (ref <18)

## 2023-03-07 NOTE — Telephone Encounter (Signed)
Will await ER report.  Thanks.   

## 2023-03-07 NOTE — Telephone Encounter (Signed)
[facility] - Client TELEPHONE ADVICE RECORD [facility] Patient Name: Samuel Newton AS Gender: Male DOB: 28-Dec-1980 Age: 42 Y 56 M 28 D Return Phone Number: [phone] (Primary) Address: City/ State/ Zip: [place]  [address] Client [facility] - Client Client Site [place] - Day Provider Elsie Stain "Brigitte Pulse MD Contact Type Call Who Is Calling Patient / Member / Family / Caregiver Call Type Triage / Clinical Relationship To Patient Self Return Phone Number [phone] (Primary) Chief Complaint CHEST PAIN - pain, pressure, heaviness or tightness Reason for Call Symptomatic / Request for Health Information Initial Comment Caller was transferred from the office, patient is experiencing chest pain. Translation No Nurse Assessment Nurse: Radford Pax, RN, Eugene Garnet Date/Time Eilene Ghazi Time): 03/07/2023 10:53:34 AM Confirm and document reason for call. If symptomatic, describe symptoms. ---Off and on chest pain. Feels like a muscle pain. Does the patient have any new or worsening symptoms? ---Yes Will a triage be completed? ---Yes Related visit to physician within the last 2 weeks? ---No Does the PT have any chronic conditions? (i.e. diabetes, asthma, this includes High risk factors for pregnancy, etc.) ---No Is this a behavioral health or substance abuse call? ---No Guidelines Guideline Title Affirmed Question Affirmed Notes Nurse Date/Time (Eastern Time) Chest Pain [1] Chest pain (or "angina") comes and goes AND [2] is happening more often (increasing in frequency) or getting worse (increasing in severity) (Exception: Chest pains that last only a few seconds.) Turner, RN, Eugene Garnet 03/07/2023 10:55:55 AM PLEASE NOTE: All timestamps contained within this report are represented as [place] Standard Time. CONFIDENTIALTY NOTICE: This fax transmission is intended only for the addressee. It contains  information that is legally privileged, confidential or otherwise protected from use or disclosure. If you are not the intended recipient, you are strictly prohibited from reviewing, disclosing, copying using or disseminating any of this information or taking any action in reliance on or regarding this information. If you have received this fax in error, please notify us immediately by telephone so that we can arrange for its return to [place]. Phone: [phone], Toll-Free: [phone], Fax: [phone] Page: 2 of 2 Call Id: [id number] [place]. Time Eilene Ghazi Time) Disposition Final User 03/07/2023 10:52:04 AM Send to Urgent Clarnce Flock 03/07/2023 11:00:24 AM Go to ED Now Yes Radford Pax, RN, Eugene Garnet Final Disposition 03/07/2023 11:00:24 AM Go to ED Now Yes Radford Pax, RN, Sharion Settler Disagree/Comply Comply Caller Understands Yes PreDisposition Call Doctor Care Advice Given Per Guideline GO TO ED NOW: * You need to be seen in the Emergency Department. NOTHING BY MOUTH: * Do not eat or drink anything for now. CALL EMS IF: * Severe difficulty breathing occurs * You become worse CARE ADVICE given per Chest Pain (Adult) guideline. Referrals [place]

## 2023-03-07 NOTE — ED Provider Notes (Signed)
[facility] Provider Note   CSN: [id number] Arrival date & time: 03/07/23  1133     History  Chief Complaint  Patient presents with   Chest Pain    Dacen Prudencio is a 42 y.o. male.  The history is provided by the patient and medical records. No language interpreter was used.  Chest Pain    42 year old male presenting here with complaint of chest pain.  Patient report he has had intermittent chest discomfort for several months.  Described pain as a muscle pull sensation on the left side of his chest that happens sporadically sometimes lasting for entire day.  Pain is not associate with lightheadedness, dizziness, nausea, diaphoresis, or shortness of breath.  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  He reach out to his PCP who recommend patient come to the ER for evaluation.  Patient mention his pain is mostly improving.  In the past pain seems to help with ibuprofen.  He denies having fever or chills no runny nose sneezing or coughing no exertional chest pain.  He denies tobacco use strong family history of cardiac disease.  Denies any strenuous activities or heavy lifting.  Home Medications Prior to Admission medications   Medication Sig Start Date End Date Taking? Authorizing Provider  nitroGLYCERIN (NITRODUR - DOSED IN MG/24 HR) 0.2 mg/hr patch Apply 1/4 patch to affected area as directed by MD and change every 24 hours. 10/30/22   Copland, Frederico Hamman, MD  Omega-3 Fatty Acids (FISH OIL PO) Take by mouth.    [provider]  valACYclovir (VALTREX) 1000 MG tablet Take 1 tablet (1,000 mg total) by mouth 3 (three) times daily. Take for 1 day.  Disp for 7 courses. 10/30/22   Owens Loffler, MD      Allergies    Patient has no known allergies.    Review of Systems   Review of Systems  Cardiovascular:  Positive for chest pain.  All other systems reviewed and are  negative.   Physical Exam Updated Vital Signs BP 125/82   Pulse 67   Temp 97.6 F (36.4 C) (Oral)   Resp 12   SpO2 96%  Physical Exam Vitals and nursing note reviewed.  Constitutional:      General: He is not in acute distress.    Appearance: He is well-developed.  HENT:     Head: Atraumatic.  Eyes:     Conjunctiva/sclera: Conjunctivae normal.  Cardiovascular:     Rate and Rhythm: Normal rate and regular rhythm.     Pulses: Normal pulses.     Heart sounds: Normal heart sounds.  Pulmonary:     Effort: Pulmonary effort is normal.     Breath sounds: Normal breath sounds. No wheezing, rhonchi or rales.  Chest:     Chest wall: No tenderness.  Abdominal:     Palpations: Abdomen is soft.     Tenderness: There is no abdominal tenderness.  Musculoskeletal:     Cervical back: Neck supple.     Right lower leg: No edema.     Left lower leg: No edema.  Skin:    Findings: No rash.  Neurological:     Mental Status: He is alert.     ED Results / Procedures / Treatments   Labs (all labs ordered are listed, but only abnormal results are displayed) Labs Reviewed  BASIC METABOLIC PANEL - Abnormal; Notable for the  following components:      Result Value   Calcium 8.8 (*)    All other components within normal limits  CBC  TROPONIN I (HIGH SENSITIVITY)  TROPONIN I (HIGH SENSITIVITY)    EKG None  Date: 03/07/2023  Rate: 74  Rhythm: normal sinus rhythm  QRS Axis: normal  Intervals: normal  ST/T Wave abnormalities: normal  Conduction Disutrbances: none  Narrative Interpretation:   Old EKG Reviewed: No significant changes noted    Radiology DG Chest 2 View  Result Date: 03/07/2023 CLINICAL DATA:  Chest pain in 42 year old male. EXAM: CHEST - 2 VIEW COMPARISON:  None Available. FINDINGS: EKG leads project over the chest. Cardiomediastinal contours and hilar structures are normal. No lobar consolidation, pleural effusion or pneumothorax. On limited assessment no acute  skeletal process. Nodular density projects over the RIGHT lower chest up to 9 mm size. IMPRESSION: 1. No acute cardiopulmonary disease. 2. Nodular density projects over the RIGHT lower chest up to 9 mm size. This may represent a nipple shadow. Consider repeat PA and lateral chest with nipple markers in place when the patient is able to exclude small nodule. Electronically Signed   By: Zetta Bills M.D.   On: 03/07/2023 12:42    Procedures Procedures    Medications Ordered in ED Medications - No data to display  ED Course/ Medical Decision Making/ A&P                             Medical Decision Making Amount and/or Complexity of Data Reviewed Labs: ordered. Radiology: ordered.   BP 136/88   Pulse 63   Temp 98.4 F (36.9 C) (Oral)   Resp 18   SpO2 98%   79:45 PM  42 year old male presenting here with complaint of chest pain.  Patient report he has had intermittent chest discomfort for several months.  Described pain as a muscle pull sensation on the left side of his chest that happens sporadically sometimes lasting for entire day.  Pain is not associate with lightheadedness, dizziness, nausea, diaphoresis, or shortness of breath.  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  He reach out to his PCP who recommend patient come to the ER for evaluation.  Patient mention his pain is mostly improving.  In the past pain seems to help with ibuprofen.  He denies having fever or chills no runny nose sneezing or coughing no exertional chest pain.  He denies tobacco use strong family history of cardiac disease.  Denies any strenuous activities or heavy lifting.  On exam this is a well-appearing male appears to be in no acute discomfort.  Heart with normal rate and rhythm, lungs clear to auscultation bilaterally abdomen is soft nontender, no peripheral edema.  No reproducible chest wall tenderness or overlying skin changes.  -Labs  ordered, independently viewed and interpreted by me.  Labs remarkable for normal troponin, normal electrolytes panel -The patient was maintained on a cardiac monitor.  I personally viewed and interpreted the cardiac monitored which showed an underlying rhythm of: NSR -Imaging independently viewed and interpreted by me and I agree with radiologist's interpretation.  Result remarkable for CXR without acute changes.  A nodule noted to R lower chest, likely nipple shadow.  This is not the location that pt has pain.  He may have a repeat chest xray outpt if concerns.  He does not have any significant  risk factors for lung cancer.  I offer repeat xray, pt declined.   -This patient presents to the ED for concern of chest pain, this involves an extensive number of treatment options, and is a complaint that carries with it a high risk of complications and morbidity.  The differential diagnosis includes panic attack, GERD, gastritis, pleurisy, pna, acs, PE, shingle -Co morbidities that complicate the patient evaluation includes none -Treatment includes none -Reevaluation of the patient after these medicines showed that the patient resolved -PCP office notes or outside notes reviewed -Escalation to admission/observation considered: patients feels much better, is comfortable with discharge, and will follow up with PCP -Prescription medication considered, patient comfortable with OTC meds -Social Determinant of Health considered   4:54 PM Patient with a heart score of 0, low risk of Mace.  He is PERC negative, doubt PE.  Negative delta troponin, labs overall reassuring, chest x-ray and EKG unremarkable at this time patient is symptom-free and is stable to be discharged home to follow-up outpatient with PCP.  Return precaution given.         Final Clinical Impression(s) / ED Diagnoses Final diagnoses:  Atypical chest pain    Rx / DC Orders ED Discharge Orders     None         Domenic Moras,  PA-C 03/07/23 1655    Charlesetta Shanks, MD 03/07/23 1731

## 2023-03-07 NOTE — ED Triage Notes (Signed)
Patient here with complaint of intermittent left sided chest pain that reminds of him of having a pulled muscle. Patient states he called his PCP and was referred to ED. Patient occurs randomly, sometimes on waking or in the middle of the day. Patient denies any alleviating or aggravating factors. Patient is alert, oriented, speaking in complete sentences, and is in no apparent distress at this time.

## 2023-03-07 NOTE — Telephone Encounter (Signed)
I spoke with pt; pt is just arriving at [facility] ED. Sending note to Dr Damita Dunnings and Damita Dunnings pool.

## 2023-03-08 ENCOUNTER — Telehealth: Payer: Self-pay

## 2023-03-08 NOTE — Transitions of Care (Post Inpatient/ED Visit) (Signed)
   03/08/2023  Name: Samuel Newton MRN: [id number] DOB: 1981-08-13  Today's TOC FU Call Status:    Attempted to reach the patient regarding the most recent Inpatient/ED visit.  Follow Up Plan: Additional outreach attempts will be made to reach the patient to complete the Transitions of Care (Post Inpatient/ED visit) call.   [place] LPN [place] Advisor Direct Dial [phone]

## 2023-03-12 NOTE — Transitions of Care (Post Inpatient/ED Visit) (Signed)
   03/12/2023  Name: Samuel Newton MRN: [id number] DOB: 10-12-1981  Today's TOC FU Call Status: Today's TOC FU Call Status:: Unsuccessful Call (2nd Attempt) Unsuccessful Call (1st Attempt) Date: 03/08/23 Unsuccessful Call (2nd Attempt) Date: 03/12/23  Attempted to reach the patient regarding the most recent Inpatient/ED visit.  Follow Up Plan: Additional outreach attempts will be made to reach the patient to complete the Transitions of Care (Post Inpatient/ED visit) call.   Signature Francella Solian, CMA

## 2023-03-12 NOTE — Telephone Encounter (Signed)
Patient returned call regarding this e/d f/u call. He is scheduled to be seen on 03/15/2023.

## 2023-03-13 NOTE — Transitions of Care (Post Inpatient/ED Visit) (Signed)
   03/13/2023  Name: Samuel Newton MRN: [id number] DOB: 21-Aug-1981  Today's TOC FU Call Status: Today's TOC FU Call Status:: Successful TOC FU Call Competed Unsuccessful Call (1st Attempt) Date: 03/08/23 Unsuccessful Call (2nd Attempt) Date: 03/12/23 [facility] FU Call Complete Date: 03/13/23  Transition Care Management Follow-up Telephone Call Date of Discharge: 03/07/23 Discharge [facility] Type of Discharge: Emergency Department Reason for ED Visit: Cardiac Conditions Cardiac Conditions Diagnosis:  (Atypical chest pain) How have you been since you were released from the hospital?: Better Any questions or concerns?: Yes  Items Reviewed: Did you receive and understand the discharge instructions provided?: No Medications obtained and verified?: Yes (Medications Reviewed) Any new allergies since your discharge?: No Dietary orders reviewed?: NA Do you have support at home?: Yes  Home Care and Equipment/Supplies: [place] Ordered?: NA Any new equipment or medical supplies ordered?: NA  Functional Questionnaire: Do you need assistance with bathing/showering or dressing?: No Do you need assistance with meal preparation?: No Do you need assistance with eating?: No Do you have difficulty maintaining continence: No Do you need assistance with getting out of bed/getting out of a chair/moving?: No Do you have difficulty managing or taking your medications?: No  Follow up appointments reviewed: PCP Follow-up appointment confirmed?: Yes Date of PCP follow-up appointment?: 03/15/23 Follow-up Provider: Dr Damita Dunnings [facility] Follow-up appointment confirmed?: NA Do you need transportation to your follow-up appointment?: No Do you understand care options if your condition(s) worsen?: Yes-patient verbalized understanding    [place] LPN [place] Direct Dial [phone]

## 2023-03-15 ENCOUNTER — Encounter: Payer: Self-pay | Admitting: Family Medicine

## 2023-03-15 ENCOUNTER — Ambulatory Visit (INDEPENDENT_AMBULATORY_CARE_PROVIDER_SITE_OTHER)
Admission: RE | Admit: 2023-03-15 | Discharge: 2023-03-15 | Disposition: A | Discharge status: Home / Self Care | Payer: BC Managed Care – PPO | Source: Ambulatory Visit | Attending: Family Medicine | Admitting: Family Medicine

## 2023-03-15 ENCOUNTER — Ambulatory Visit: Payer: BC Managed Care – PPO | Admitting: Family Medicine

## 2023-03-15 VITALS — BP 108/76 | HR 66 | Temp 98.2°F | Ht 76.25 in | Wt 236.0 lb

## 2023-03-15 DIAGNOSIS — R9389 Abnormal findings on diagnostic imaging of other specified body structures: Secondary | ICD-10-CM

## 2023-03-15 DIAGNOSIS — G4730 Sleep apnea, unspecified: Secondary | ICD-10-CM

## 2023-03-15 NOTE — Progress Notes (Signed)
ER follow up.    IMPRESSION: 1. No acute cardiopulmonary disease. 2. Nodular density projects over the RIGHT lower chest up to 9 mm size. This may represent a nipple shadow. Consider repeat PA and lateral chest with nipple markers in place when the patient is able to exclude small nodule.  We talked about prev discussion re: chest discomfort. From 03/03/21 "Center of chest is sore, medial pectoral area noted with ROM for deep bench press.  Slowly getting better.   Clearly reproducible.  No exertional symptoms otherwise."  Discussed more recent symptoms.Pain resolved in the meantime, after a day.  He doesn't have exertional sx.  Some nsaid use but not frequently.  He had not used nsaids right before pain started.    Wife noted patient stopping breathing at night.  He is trying to sleep on his side.  Discussed recheck with [facility] re: OSA.   Meds, vitals, and allergies reviewed.   ROS: Per HPI unless specifically indicated in ROS section   GEN: nad, alert and oriented HEENT: ncat NECK: supple w/o LA CV: rrr.  PULM: ctab, no inc wob ABD: soft, +bs EXT: no edema SKIN: no acute rash 16.5" neck circumference.

## 2023-03-15 NOTE — Patient Instructions (Addendum)
Go to the lab on the way out.   If you have mychart we'll likely use that to update you.    Take care.  Glad to see you. Try using TUMS if recurrent episodes.

## 2023-03-18 DIAGNOSIS — G4730 Sleep apnea, unspecified: Secondary | ICD-10-CM | POA: Insufficient documentation

## 2023-03-18 DIAGNOSIS — R9389 Abnormal findings on diagnostic imaging of other specified body structures: Secondary | ICD-10-CM | POA: Insufficient documentation

## 2023-03-18 NOTE — Assessment & Plan Note (Signed)
See notes on follow-up chest x-ray.  Discussed that he was not at high risk for a coronary issue causing his pain.  It is reasonable to consider using Tums if he has another episode to see if that makes a difference.  See after visit summary.  Previous x-ray discussed with patient.

## 2023-03-18 NOTE — Assessment & Plan Note (Signed)
Refer to [facility] 

## 2024-05-01 ENCOUNTER — Telehealth: Payer: Self-pay

## 2024-05-01 DIAGNOSIS — Z131 Encounter for screening for diabetes mellitus: Secondary | ICD-10-CM

## 2024-05-01 DIAGNOSIS — Z13220 Encounter for screening for lipoid disorders: Secondary | ICD-10-CM

## 2024-05-01 NOTE — Telephone Encounter (Signed)
 Copied from CRM [id number]. Topic: Clinical - Lab/Test Results >> Apr 30, 2024  3:58 PM Kita Perish H wrote: Reason for CRM: Patient wants to have labs done prior to physical appointment scheduled for 6/30, no orders in system.  Josh [phone]

## 2024-05-02 NOTE — Telephone Encounter (Signed)
 He has been scheduled for labs the lab orders just need to be placed

## 2024-05-04 NOTE — Telephone Encounter (Signed)
 Ordered. Thanks

## 2024-05-04 NOTE — Addendum Note (Signed)
 Addended by: Donnie Galea on: 05/04/2024 02:59 PM   Modules accepted: Orders

## 2024-06-09 ENCOUNTER — Ambulatory Visit: Payer: Self-pay | Admitting: Family Medicine

## 2024-06-09 ENCOUNTER — Other Ambulatory Visit (INDEPENDENT_AMBULATORY_CARE_PROVIDER_SITE_OTHER): Payer: Self-pay

## 2024-06-09 DIAGNOSIS — Z13220 Encounter for screening for lipoid disorders: Secondary | ICD-10-CM

## 2024-06-09 DIAGNOSIS — Z131 Encounter for screening for diabetes mellitus: Secondary | ICD-10-CM | POA: Diagnosis not present

## 2024-06-09 LAB — LIPID PANEL
Cholesterol: 143 mg/dL (ref 0–200)
HDL: 36.5 mg/dL — ABNORMAL LOW (ref >39.00)
LDL Cholesterol: 91 mg/dL (ref 0–99)
NonHDL: 106
Total CHOL/HDL Ratio: 4
Triglycerides: 74 mg/dL (ref 0.0–149.0)
VLDL: 14.8 mg/dL (ref 0.0–40.0)

## 2024-06-09 LAB — GLUCOSE, RANDOM: Glucose, Bld: 91 mg/dL (ref 70–99)

## 2024-06-16 ENCOUNTER — Encounter: Payer: Self-pay | Admitting: Family Medicine

## 2024-06-16 ENCOUNTER — Ambulatory Visit (INDEPENDENT_AMBULATORY_CARE_PROVIDER_SITE_OTHER): Payer: Self-pay | Admitting: Family Medicine

## 2024-06-16 VITALS — BP 106/58 | HR 75 | Temp 98.5°F | Ht 76.1 in | Wt 232.0 lb

## 2024-06-16 DIAGNOSIS — G4730 Sleep apnea, unspecified: Secondary | ICD-10-CM

## 2024-06-16 DIAGNOSIS — Z0000 Encounter for general adult medical examination without abnormal findings: Secondary | ICD-10-CM | POA: Diagnosis not present

## 2024-06-16 DIAGNOSIS — Z7189 Other specified counseling: Secondary | ICD-10-CM

## 2024-06-16 DIAGNOSIS — Z8249 Family history of ischemic heart disease and other diseases of the circulatory system: Secondary | ICD-10-CM

## 2024-06-16 NOTE — Progress Notes (Unsigned)
 CPE- See plan.  Routine anticipatory guidance given to patient.  See health maintenance.  The possibility exists that previously documented standard health maintenance information may have been brought forward from a previous encounter into this note.  If needed, that same information has been updated to reflect the current situation based on today's encounter.    He got a call from the cancer clinic- this was likely a wrong number.  D/w pt. I do not see any outgoing message recorded in his chart and I do not see any issue to be addressed by the cancer center.  Tetanus 2012 Flu shot encouraged, done yearly.   covid vaccine prev done.  PNA and shingles not due.   Colon and prostate cancer screening not due.  Diet and exercise d/w pt.  Living will d/w pt. Wife designated if patient if incapacitated.  Labs d/w pt, sugar wnl.   He had sleep study done.  Started CPAP and compliant.  He had to get used to the mask, etc.  He doesn't feel a sig change in the meantime, not yet.    FH CAD.  His father had valve replacement with CABG in his 5s.  D/w pt about possible calcium scoring later in his 30s.  No CP.  He is still able to jog, restarting cough to 5K.  He can run a mile w/o CP.    PMH and SH reviewed  Meds, vitals, and allergies reviewed.   ROS: Per HPI.  Unless specifically indicated otherwise in HPI, the patient denies:  General: fever. Eyes: acute vision changes ENT: sore throat Cardiovascular: chest pain Respiratory: SOB GI: vomiting GU: dysuria Musculoskeletal: acute back pain Derm: acute rash Neuro: acute motor dysfunction Psych: worsening mood Endocrine: polydipsia Heme: bleeding Allergy: hayfever  GEN: nad, alert and oriented HEENT: ncat, mild cerumen deposit bilaterally without TM obstruction NECK: supple w/o LA CV: rrr. PULM: ctab, no inc wob ABD: soft, +bs EXT: no edema SKIN: no acute rash  Labs discussed with patient.  The 10-year ASCVD risk score (Arnett DK,  et al., 2019) is: 1%   Values used to calculate the score:     Age: 43 years     Clincally relevant sex: Male     Is Non-Hispanic African American: No     Diabetic: No     Tobacco smoker: No     Systolic Blood Pressure: 106 mmHg     Is BP treated: No     HDL Cholesterol: 36.5 mg/dL     Total Cholesterol: 143 mg/dL

## 2024-06-16 NOTE — Patient Instructions (Signed)
 Tetanus and flu this fall.  Take care.  Glad to see you. Debrox and rinse.  Update me as needed.

## 2024-06-18 DIAGNOSIS — Z8249 Family history of ischemic heart disease and other diseases of the circulatory system: Secondary | ICD-10-CM | POA: Insufficient documentation

## 2024-06-18 NOTE — Assessment & Plan Note (Signed)
Living will d/w pt. Wife designated if patient if incapacitated.  

## 2024-06-18 NOTE — Assessment & Plan Note (Signed)
 FH CAD.  His father had valve replacement with CABG in his 79s.  D/w pt about possible calcium scoring later in his 29s.  No CP.  He is still able to jog, restarting cough to 5K.  He can run a mile w/o CP.

## 2024-06-18 NOTE — Assessment & Plan Note (Signed)
 Would still continue as is with CPAP.  Compliant.

## 2024-06-18 NOTE — Assessment & Plan Note (Signed)
 Tetanus 2012 Flu shot encouraged, done yearly.   covid vaccine prev done.  PNA and shingles not due.   Colon and prostate cancer screening not due.  Diet and exercise d/w pt.  Living will d/w pt. Wife designated if patient if incapacitated.  Labs d/w pt, sugar wnl.

## 2024-06-27 ENCOUNTER — Other Ambulatory Visit: Payer: Self-pay | Admitting: Family Medicine

## 2024-06-27 MED ORDER — VALACYCLOVIR HCL 1 G PO TABS: 1000.0000 mg | ORAL_TABLET | Freq: Three times a day (TID) | ORAL | 0 refills | Status: AC

## 2024-06-27 NOTE — Telephone Encounter (Signed)
 Copied from CRM [id number]. Topic: Clinical - Medication Refill >> Jun 27, 2024  9:30 AM [name] wrote: Medication: valACYclovir  (VALTREX ) 1000 MG tablet  Please note, this is something Pt and Samuel Newton spoke about in his last appt on 6/30 and he thought he had enough refills, but does not. He would like Samuel Newton to be the one to prescribe this for him please.  Has the patient contacted their pharmacy? Yes (Agent: If no, request that the patient contact the pharmacy for the refill. If patient does not wish to contact the pharmacy document the reason why and proceed with request.) (Agent: If yes, when and what did the pharmacy advise?)  This is the patient's preferred pharmacy:  [facility] [phone] - [place], [place] - [address] AT [facility] [address] [place] [address] Phone: [phone] Fax: [phone]  Is this the correct pharmacy for this prescription? Yes If no, delete pharmacy and type the correct one.   Has the prescription been filled recently? No  Is the patient out of the medication? Yes  Has the patient been seen for an appointment in the last year OR does the patient have an upcoming appointment? Yes  Can we respond through MyChart? Yes  Agent: Please be advised that Rx refills may take up to 3 business days. We ask that you follow-up with your pharmacy.

## 2024-06-27 NOTE — Telephone Encounter (Signed)
 Sent. Thanks.

## 2024-10-23 ENCOUNTER — Telehealth: Payer: Self-pay | Admitting: Family Medicine

## 2024-10-23 ENCOUNTER — Ambulatory Visit: Payer: Self-pay

## 2024-10-23 NOTE — Telephone Encounter (Signed)
 FYI Only or Action Required?: FYI only for provider: appointment scheduled on 11/7 at [facility].  Patient was last seen in primary care on 06/16/2024 by Cleatus Arlyss RAMAN, MD.  Called Nurse Triage reporting Chest Pain.  Symptoms began several days ago.  Interventions attempted: OTC medications: Tylenol.  Symptoms are: unchanged.  Triage Disposition: See Physician Within 24 Hours  Patient/caregiver understands and will follow disposition?: Yes  Copied from CRM [id number]. Topic: Clinical - Red Word Triage >> Oct 23, 2024  8:23 AM Samuel Newton wrote: Red Word that prompted transfer to Nurse Triage:  He is having pain in his lower right side area that is getting worse and will not go away. Right now pain is at a 3. He is wanting an appointment to check it out. Reason for Disposition  [1] MILD pain (e.g., does not interfere with normal activities) AND [2] comes and goes (cramps) AND [3] present > 72 hours  (Exception: This same abdominal pain is a chronic symptom recurrent or ongoing AND present > 4 weeks.)  Answer Assessment - Initial Assessment Questions 1. LOCATION: Where does it hurt?      Right upper abdomen, under ribs area  2. RADIATION: Does the pain shoot anywhere else? (e.g., chest, back)     No  3. ONSET: When did the pain begin? (e.g., minutes, hours or days ago)      2 days ago  4. SUDDEN: Gradual or sudden onset?     Gradual  5. PATTERN Does the pain come and go, or is it constant?    Constant  6. SEVERITY: How bad is the pain?  (e.g., Scale 1-10; mild, moderate, or severe)     3/10, but is increasing  7. RECURRENT SYMPTOM: Have you ever had this type of stomach pain before? If Yes, ask: When was the last time? and What happened that time?      No  8. AGGRAVATING FACTORS: Does anything seem to cause this pain? (e.g., foods, stress, alcohol)     Deep breaths, pain with sneezing and movement  9. CARDIAC SYMPTOMS: Do you have any of the  following symptoms: chest pain, difficulty breathing, sweating, nausea?     No  10. OTHER SYMPTOMS: Do you have any other symptoms? (e.g., back pain, diarrhea, fever, urination pain, vomiting)       Runny nose (hx of allergies)  Protocols used: Abdominal Pain - Upper-A-AH

## 2024-10-23 NOTE — Telephone Encounter (Signed)
 Lm and sent MyChart that patient's appointment on 10/24/2024 has been changed to [facility]( Dr Cleatus patient) location with Dr Bennett at 10:40 am. E2C2 triage nurse scheduled at wrong location.

## 2024-10-24 ENCOUNTER — Ambulatory Visit

## 2024-10-24 ENCOUNTER — Ambulatory Visit: Payer: Self-pay

## 2024-10-24 ENCOUNTER — Ambulatory Visit (INDEPENDENT_AMBULATORY_CARE_PROVIDER_SITE_OTHER)
Admission: RE | Admit: 2024-10-24 | Discharge: 2024-10-24 | Disposition: A | Discharge status: Home / Self Care | Source: Ambulatory Visit

## 2024-10-24 ENCOUNTER — Telehealth: Payer: Self-pay

## 2024-10-24 ENCOUNTER — Ambulatory Visit: Admitting: Internal Medicine

## 2024-10-24 VITALS — BP 122/78 | HR 73 | Temp 98.6°F | Ht 76.25 in | Wt 228.0 lb

## 2024-10-24 DIAGNOSIS — R079 Chest pain, unspecified: Secondary | ICD-10-CM

## 2024-10-24 LAB — C-REACTIVE PROTEIN: CRP: 0.7 mg/dL (ref 0.5–20.0)

## 2024-10-24 NOTE — Progress Notes (Signed)
 Subjective:   This visit was conducted in person. The patient gave informed consent to the use of Abridge AI technology to record the contents of the encounter as documented below.   Patient ID: Samuel Newton, male    DOB: [birth year], 43 y.o.   MRN: [id number]   Discussed the use of AI scribe software for clinical note transcription with the patient, who gave verbal consent to proceed.  History of Present Illness Samuel Newton is a 43 year old male who presents with right-sided chest pain.  He has been experiencing right-sided chest pain located on the lower part of his chest, described as sharp and feeling like it's on top of the rib cage. The pain began on Tuesday evening and has persisted for several days. It is exacerbated by deep breathing and certain movements, such as laughing or sneezing, with a pain level of 3 to 4 out of 10 at its worst. Heat, such as during a shower, provides some relief.  The pain does not radiate and remains localized to the initial area. No shortness of breath, heart palpitations, lightheadedness, or pain in other areas such as the left arm, jaw, or neck. There are no associated symptoms like fever, chills, or cough, and he denies any recent strenuous activity that could have triggered the pain.  He has attempted to manage the pain with ibuprofen, taking 400 mg doses on Tuesday and the following day, but it did not significantly alleviate the pain. He also uses a CPAP machine for sleep apnea, which he felt helped with breathing overnight.  His past medical history includes the use of Valtrex  as needed for cold sores, which he took on Monday, and daily fish oil supplements. He denies any history of heart issues and has a minimal smoking history, with fewer than ten cigarettes smoked in his lifetime, over twenty years ago.    Review of Systems  HENT:  Sinus pressure: d dimer.   All other systems reviewed and are negative.       No Known  Allergies  Current Outpatient Medications on File Prior to Visit  Medication Sig Dispense Refill   Omega-3 Fatty Acids (FISH OIL PO) Take by mouth.     valACYclovir  (VALTREX ) 1000 MG tablet Take 1 tablet (1,000 mg total) by mouth 3 (three) times daily. Take for 1 day.  Disp for 7 courses. 21 tablet 0   No current facility-administered medications on file prior to visit.    BP 122/78 (BP Location: Left Arm, Patient Position: Sitting, Cuff Size: Large)   Pulse 73   Temp 98.6 F (37 C) (Oral)   Ht 6' 4.25 (1.937 m)   Wt 228 lb (103.4 kg)   SpO2 95%   BMI 27.57 kg/m   Objective:      Physical Exam GENERAL: Alert, cooperative, well developed, no acute distress. HEAD: Normocephalic atraumatic. EYES: Extraocular movements intact BL, pupils round, equal and reactive to light BL, conjunctivae normal BL. CARDIOVASCULAR: Normal heart rate and rhythm, S1 and S2 normal without murmurs.  CHEST: Clear to auscultation bilaterally, no wheezes, rhonchi, or crackles.  Normal breath sounds throughout EXTREMITIES: No cyanosis or edema. NEUROLOGICAL: Oriented to person, place and time, no gait abnormalities, moves all extremities without gross motor or sensory deficit. MUSCULOSKELETAL: Localized point tenderness on right chest wall, between the 10th and 11th rib, no pain on chest wall manipulation. Chest pain alleviated on leaning forward.       Assessment & Plan:  Assessment & Plan Right-sided chest wall pain Intermittent sharp pain localized to right rib cage, exacerbated by movement and deep breathing, alleviated by heat. Differential includes musculoskeletal strain, costochondritis, pulmonary bleb, and pericarditis.  Normal breath sounds throughout, low suspicion for spontaneous pneumothorax, however, will still order CXR to rule out any structural lung abnormalities.  No weight loss. Point tenderness to palpation over the right intercostal muscles between the ribs would suggest  costochondritis.  Will rule out pericarditis with EKG, also ordered CRP, given alleviation of pleuritic pain on leaning forward.  No other autoimmune symptoms though.  Should workup be entirely negative, will treat as costochondritis. Low suspicion for pneumonia.  PE remains a remote possibility, given pleuritic chest pain, not tachycardic in office today, Wells score is 3 indicating moderate risk, will start with D-dimer.  - Ordered EKG to evaluate cardiac issues. - Ordered chest x-ray to assess pulmonary issues. - Ordered CRP and D-dimer. - Consider musculoskeletal origin if tests are unrevealing and discuss pain relief options.   Follow-up TBD based on results.  Samuel Fornwalt K Massiah Longanecker, MD  10/24/24     Contains text generated by [employer]bridge.

## 2024-10-24 NOTE — Telephone Encounter (Signed)
 Pls call pt and advise that his chest xray and EKG were normal, we are waiting for his labs still.

## 2024-10-24 NOTE — Patient Instructions (Signed)
 Thank you for visiting [facility] today! Here's what we talked about: - I will be in touch with results with plan for treatment and follow up

## 2024-10-24 NOTE — Telephone Encounter (Signed)
 Thank you for getting patient scheduled.

## 2024-10-24 NOTE — Telephone Encounter (Signed)
 Spoke to pt.

## 2024-10-25 LAB — D-DIMER, QUANTITATIVE: D-Dimer, Quant: 0.47 ug{FEU}/mL (ref <0.50)
